# Patient Record
Sex: Female | Born: 1995 | Race: Black or African American | Hispanic: No | Marital: Single | Smoking: Never smoker
Health system: Southern US, Community
[De-identification: ages and names within clinical notes are randomized; demographics above are authoritative.]

## PROBLEM LIST (undated history)

## (undated) ENCOUNTER — Inpatient Hospital Stay (HOSPITAL_COMMUNITY): Payer: Self-pay

## (undated) DIAGNOSIS — O26613 Liver and biliary tract disorders in pregnancy, third trimester: Secondary | ICD-10-CM

## (undated) DIAGNOSIS — K831 Obstruction of bile duct: Secondary | ICD-10-CM

## (undated) DIAGNOSIS — A6009 Herpesviral infection of other urogenital tract: Secondary | ICD-10-CM

## (undated) DIAGNOSIS — A549 Gonococcal infection, unspecified: Secondary | ICD-10-CM

## (undated) HISTORY — DX: Liver and biliary tract disorders in pregnancy, third trimester: O26.613

## (undated) HISTORY — DX: Obstruction of bile duct: K83.1

## (undated) HISTORY — DX: Herpesviral infection of other urogenital tract: A60.09

---
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
## 2017-03-10 DIAGNOSIS — O0993 Supervision of high risk pregnancy, unspecified, third trimester: Secondary | ICD-10-CM | POA: Insufficient documentation

## 2017-03-11 ENCOUNTER — Ambulatory Visit (INDEPENDENT_AMBULATORY_CARE_PROVIDER_SITE_OTHER): Payer: BLUE CROSS/BLUE SHIELD | Admitting: Obstetrics and Gynecology

## 2017-03-11 ENCOUNTER — Encounter: Payer: Self-pay | Admitting: Obstetrics and Gynecology

## 2017-03-11 ENCOUNTER — Other Ambulatory Visit (HOSPITAL_COMMUNITY)
Admission: RE | Admit: 2017-03-11 | Discharge: 2017-03-11 | Disposition: A | Discharge status: Home / Self Care | Payer: BLUE CROSS/BLUE SHIELD | Source: Ambulatory Visit | Attending: Obstetrics and Gynecology | Admitting: Obstetrics and Gynecology

## 2017-03-11 DIAGNOSIS — Z3401 Encounter for supervision of normal first pregnancy, first trimester: Secondary | ICD-10-CM

## 2017-03-11 DIAGNOSIS — Z3A10 10 weeks gestation of pregnancy: Secondary | ICD-10-CM | POA: Insufficient documentation

## 2017-03-11 DIAGNOSIS — Z113 Encounter for screening for infections with a predominantly sexual mode of transmission: Secondary | ICD-10-CM | POA: Diagnosis not present

## 2017-03-11 DIAGNOSIS — Z3490 Encounter for supervision of normal pregnancy, unspecified, unspecified trimester: Secondary | ICD-10-CM

## 2017-03-11 MED ORDER — DOXYLAMINE-PYRIDOXINE 10-10 MG PO TBEC: DELAYED_RELEASE_TABLET | ORAL | 6 refills | Status: DC

## 2017-03-11 NOTE — Patient Instructions (Signed)
 First Trimester of Pregnancy The first trimester of pregnancy is from week 1 until the end of week 13 (months 1 through 3). A week after a sperm fertilizes an egg, the egg will implant on the wall of the uterus. This embryo will begin to develop into a baby. Genes from you and your partner will form the baby. The female genes will determine whether the baby will be a boy or a girl. At 6-8 weeks, the eyes and face will be formed, and the heartbeat can be seen on ultrasound. At the end of 12 weeks, all the baby's organs will be formed. Now that you are pregnant, you will want to do everything you can to have a healthy baby. Two of the most important things are to get good prenatal care and to follow your health care provider's instructions. Prenatal care is all the medical care you receive before the baby's birth. This care will help prevent, find, and treat any problems during the pregnancy and childbirth. Body changes during your first trimester Your body goes through many changes during pregnancy. The changes vary from woman to woman.  You may gain or lose a couple of pounds at first.  You may feel sick to your stomach (nauseous) and you may throw up (vomit). If the vomiting is uncontrollable, call your health care provider.  You may tire easily.  You may develop headaches that can be relieved by medicines. All medicines should be approved by your health care provider.  You may urinate more often. Painful urination may mean you have a bladder infection.  You may develop heartburn as a result of your pregnancy.  You may develop constipation because certain hormones are causing the muscles that push stool through your intestines to slow down.  You may develop hemorrhoids or swollen veins (varicose veins).  Your breasts may begin to grow larger and become tender. Your nipples may stick out more, and the tissue that surrounds them (areola) may become darker.  Your gums may bleed and may be  sensitive to brushing and flossing.  Dark spots or blotches (chloasma, mask of pregnancy) may develop on your face. This will likely fade after the baby is born.  Your menstrual periods will stop.  You may have a loss of appetite.  You may develop cravings for certain kinds of food.  You may have changes in your emotions from day to day, such as being excited to be pregnant or being concerned that something may go wrong with the pregnancy and baby.  You may have more vivid and strange dreams.  You may have changes in your hair. These can include thickening of your hair, rapid growth, and changes in texture. Some women also have hair loss during or after pregnancy, or hair that feels dry or thin. Your hair will most likely return to normal after your baby is born.  What to expect at prenatal visits During a routine prenatal visit:  You will be weighed to make sure you and the baby are growing normally.  Your blood pressure will be taken.  Your abdomen will be measured to track your baby's growth.  The fetal heartbeat will be listened to between weeks 10 and 14 of your pregnancy.  Test results from any previous visits will be discussed.  Your health care provider may ask you:  How you are feeling.  If you are feeling the baby move.  If you have had any abnormal symptoms, such as leaking fluid, bleeding, severe   headaches, or abdominal cramping.  If you are using any tobacco products, including cigarettes, chewing tobacco, and electronic cigarettes.  If you have any questions.  Other tests that may be performed during your first trimester include:  Blood tests to find your blood type and to check for the presence of any previous infections. The tests will also be used to check for low iron levels (anemia) and protein on red blood cells (Rh antibodies). Depending on your risk factors, or if you previously had diabetes during pregnancy, you may have tests to check for high blood  sugar that affects pregnant women (gestational diabetes).  Urine tests to check for infections, diabetes, or protein in the urine.  An ultrasound to confirm the proper growth and development of the baby.  Fetal screens for spinal cord problems (spina bifida) and Down syndrome.  HIV (human immunodeficiency virus) testing. Routine prenatal testing includes screening for HIV, unless you choose not to have this test.  You may need other tests to make sure you and the baby are doing well.  Follow these instructions at home: Medicines  Follow your health care provider's instructions regarding medicine use. Specific medicines may be either safe or unsafe to take during pregnancy.  Take a prenatal vitamin that contains at least 600 micrograms (mcg) of folic acid.  If you develop constipation, try taking a stool softener if your health care provider approves. Eating and drinking  Eat a balanced diet that includes fresh fruits and vegetables, whole grains, good sources of protein such as meat, eggs, or tofu, and low-fat dairy. Your health care provider will help you determine the amount of weight gain that is right for you.  Avoid raw meat and uncooked cheese. These carry germs that can cause birth defects in the baby.  Eating four or five small meals rather than three large meals a day may help relieve nausea and vomiting. If you start to feel nauseous, eating a few soda crackers can be helpful. Drinking liquids between meals, instead of during meals, also seems to help ease nausea and vomiting.  Limit foods that are high in fat and processed sugars, such as fried and sweet foods.  To prevent constipation: ? Eat foods that are high in fiber, such as fresh fruits and vegetables, whole grains, and beans. ? Drink enough fluid to keep your urine clear or pale yellow. Activity  Exercise only as directed by your health care provider. Most women can continue their usual exercise routine during  pregnancy. Try to exercise for 30 minutes at least 5 days a week. Exercising will help you: ? Control your weight. ? Stay in shape. ? Be prepared for labor and delivery.  Experiencing pain or cramping in the lower abdomen or lower back is a good sign that you should stop exercising. Check with your health care provider before continuing with normal exercises.  Try to avoid standing for long periods of time. Move your legs often if you must stand in one place for a long time.  Avoid heavy lifting.  Wear low-heeled shoes and practice good posture.  You may continue to have sex unless your health care provider tells you not to. Relieving pain and discomfort  Wear a good support bra to relieve breast tenderness.  Take warm sitz baths to soothe any pain or discomfort caused by hemorrhoids. Use hemorrhoid cream if your health care provider approves.  Rest with your legs elevated if you have leg cramps or low back pain.  If you   develop varicose veins in your legs, wear support hose. Elevate your feet for 15 minutes, 3-4 times a day. Limit salt in your diet. Prenatal care  Schedule your prenatal visits by the twelfth week of pregnancy. They are usually scheduled monthly at first, then more often in the last 2 months before delivery.  Write down your questions. Take them to your prenatal visits.  Keep all your prenatal visits as told by your health care provider. This is important. Safety  Wear your seat belt at all times when driving.  Make a list of emergency phone numbers, including numbers for family, friends, the hospital, and police and fire departments. General instructions  Ask your health care provider for a referral to a local prenatal education class. Begin classes no later than the beginning of month 6 of your pregnancy.  Ask for help if you have counseling or nutritional needs during pregnancy. Your health care provider can offer advice or refer you to specialists for help  with various needs.  Do not use hot tubs, steam rooms, or saunas.  Do not douche or use tampons or scented sanitary pads.  Do not cross your legs for long periods of time.  Avoid cat litter boxes and soil used by cats. These carry germs that can cause birth defects in the baby and possibly loss of the fetus by miscarriage or stillbirth.  Avoid all smoking, herbs, alcohol, and medicines not prescribed by your health care provider. Chemicals in these products affect the formation and growth of the baby.  Do not use any products that contain nicotine or tobacco, such as cigarettes and e-cigarettes. If you need help quitting, ask your health care provider. You may receive counseling support and other resources to help you quit.  Schedule a dentist appointment. At home, brush your teeth with a soft toothbrush and be gentle when you floss. Contact a health care provider if:  You have dizziness.  You have mild pelvic cramps, pelvic pressure, or nagging pain in the abdominal area.  You have persistent nausea, vomiting, or diarrhea.  You have a bad smelling vaginal discharge.  You have pain when you urinate.  You notice increased swelling in your face, hands, legs, or ankles.  You are exposed to fifth disease or chickenpox.  You are exposed to German measles (rubella) and have never had it. Get help right away if:  You have a fever.  You are leaking fluid from your vagina.  You have spotting or bleeding from your vagina.  You have severe abdominal cramping or pain.  You have rapid weight gain or loss.  You vomit blood or material that looks like coffee grounds.  You develop a severe headache.  You have shortness of breath.  You have any kind of trauma, such as from a fall or a car accident. Summary  The first trimester of pregnancy is from week 1 until the end of week 13 (months 1 through 3).  Your body goes through many changes during pregnancy. The changes vary from  woman to woman.  You will have routine prenatal visits. During those visits, your health care provider will examine you, discuss any test results you may have, and talk with you about how you are feeling. This information is not intended to replace advice given to you by your health care provider. Make sure you discuss any questions you have with your health care provider. Document Released: 10/13/2001 Document Revised: 09/30/2016 Document Reviewed: 09/30/2016 Elsevier Interactive Patient Education  2017   [employer]  Contraception Choices Contraception (birth control) is the use of any methods or devices to prevent pregnancy. Below are some methods to help avoid pregnancy. Hormonal methods  Contraceptive implant. This is a thin, plastic tube containing progesterone hormone. It does not contain estrogen hormone. Your health care provider inserts the tube in the inner part of the upper arm. The tube can remain in place for up to 3 years. After 3 years, the implant must be removed. The implant prevents the ovaries from releasing an egg (ovulation), thickens the cervical mucus to prevent sperm from entering the uterus, and thins the lining of the inside of the uterus.  Progesterone-only injections. These injections are given every 3 months by your health care provider to prevent pregnancy. This synthetic progesterone hormone stops the ovaries from releasing eggs. It also thickens cervical mucus and changes the uterine lining. This makes it harder for sperm to survive in the uterus.  Birth control pills. These pills contain estrogen and progesterone hormone. They work by preventing the ovaries from releasing eggs (ovulation). They also cause the cervical mucus to thicken, preventing the sperm from entering the uterus. Birth control pills are prescribed by a health care provider.Birth control pills can also be used to treat heavy periods.  Minipill. This type of birth control pill contains only the  progesterone hormone. They are taken every day of each month and must be prescribed by your health care provider.  Birth control patch. The patch contains hormones similar to those in birth control pills. It must be changed once a week and is prescribed by a health care provider.  Vaginal ring. The ring contains hormones similar to those in birth control pills. It is left in the vagina for 3 weeks, removed for 1 week, and then a new one is put back in place. The patient must be comfortable inserting and removing the ring from the vagina.A health care provider's prescription is necessary.  Emergency contraception. Emergency contraceptives prevent pregnancy after unprotected sexual intercourse. This pill can be taken right after sex or up to 5 days after unprotected sex. It is most effective the sooner you take the pills after having sexual intercourse. Most emergency contraceptive pills are available without a prescription. Check with your pharmacist. Do not use emergency contraception as your only form of birth control. Barrier methods  Female condom. This is a thin sheath (latex or rubber) that is worn over the penis during sexual intercourse. It can be used with spermicide to increase effectiveness.  Female condom. This is a soft, loose-fitting sheath that is put into the vagina before sexual intercourse.  Diaphragm. This is a soft, latex, dome-shaped barrier that must be fitted by a health care provider. It is inserted into the vagina, along with a spermicidal jelly. It is inserted before intercourse. The diaphragm should be left in the vagina for 6 to 8 hours after intercourse.  Cervical cap. This is a round, soft, latex or plastic cup that fits over the cervix and must be fitted by a health care provider. The cap can be left in place for up to 48 hours after intercourse.  Sponge. This is a soft, circular piece of polyurethane foam. The sponge has spermicide in it. It is inserted into the vagina  after wetting it and before sexual intercourse.  Spermicides. These are chemicals that kill or block sperm from entering the cervix and uterus. They come in the form of creams, jellies, suppositories, foam, or tablets. They do not   require a prescription. They are inserted into the vagina with an applicator before having sexual intercourse. The process must be repeated every time you have sexual intercourse. Intrauterine contraception  Intrauterine device (IUD). This is a T-shaped device that is put in a woman's uterus during a menstrual period to prevent pregnancy. There are 2 types: ? Copper IUD. This type of IUD is wrapped in copper wire and is placed inside the uterus. Copper makes the uterus and fallopian tubes produce a fluid that kills sperm. It can stay in place for 10 years. ? Hormone IUD. This type of IUD contains the hormone progestin (synthetic progesterone). The hormone thickens the cervical mucus and prevents sperm from entering the uterus, and it also thins the uterine lining to prevent implantation of a fertilized egg. The hormone can weaken or kill the sperm that get into the uterus. It can stay in place for 3-5 years, depending on which type of IUD is used. Permanent methods of contraception  Female tubal ligation. This is when the woman's fallopian tubes are surgically sealed, tied, or blocked to prevent the egg from traveling to the uterus.  Hysteroscopic sterilization. This involves placing a small coil or insert into each fallopian tube. Your doctor uses a technique called hysteroscopy to do the procedure. The device causes scar tissue to form. This results in permanent blockage of the fallopian tubes, so the sperm cannot fertilize the egg. It takes about 3 months after the procedure for the tubes to become blocked. You must use another form of birth control for these 3 months.  Female sterilization. This is when the female has the tubes that carry sperm tied off (vasectomy).This  blocks sperm from entering the vagina during sexual intercourse. After the procedure, the man can still ejaculate fluid (semen). Natural planning methods  Natural family planning. This is not having sexual intercourse or using a barrier method (condom, diaphragm, cervical cap) on days the woman could become pregnant.  Calendar method. This is keeping track of the length of each menstrual cycle and identifying when you are fertile.  Ovulation method. This is avoiding sexual intercourse during ovulation.  Symptothermal method. This is avoiding sexual intercourse during ovulation, using a thermometer and ovulation symptoms.  Post-ovulation method. This is timing sexual intercourse after you have ovulated. Regardless of which type or method of contraception you choose, it is important that you use condoms to protect against the transmission of sexually transmitted infections (STIs). Talk with your health care provider about which form of contraception is most appropriate for you. This information is not intended to replace advice given to you by your health care provider. Make sure you discuss any questions you have with your health care provider. Document Released: 10/19/2005 Document Revised: 03/26/2016 Document Reviewed: 04/13/2013 Elsevier Interactive Patient Education  2017 [employer]   Breastfeeding Deciding to breastfeed is one of the best choices you can make for you and your baby. A change in hormones during pregnancy causes your breast tissue to grow and increases the number and size of your milk ducts. These hormones also allow proteins, sugars, and fats from your blood supply to make breast milk in your milk-producing glands. Hormones prevent breast milk from being released before your baby is born as well as prompt milk flow after birth. Once breastfeeding has begun, thoughts of your baby, as well as his or her sucking or crying, can stimulate the release of milk from your  milk-producing glands. Benefits of breastfeeding For Your Baby  Your   first milk (colostrum) helps your baby's digestive system function better.  There are antibodies in your milk that help your baby fight off infections.  Your baby has a lower incidence of asthma, allergies, and sudden infant death syndrome.  The nutrients in breast milk are better for your baby than infant formulas and are designed uniquely for your baby's needs.  Breast milk improves your baby's brain development.  Your baby is less likely to develop other conditions, such as childhood obesity, asthma, or type 2 diabetes mellitus.  For You  Breastfeeding helps to create a very special bond between you and your baby.  Breastfeeding is convenient. Breast milk is always available at the correct temperature and costs nothing.  Breastfeeding helps to burn calories and helps you lose the weight gained during pregnancy.  Breastfeeding makes your uterus contract to its prepregnancy size faster and slows bleeding (lochia) after you give birth.  Breastfeeding helps to lower your risk of developing type 2 diabetes mellitus, osteoporosis, and breast or ovarian cancer later in life.  Signs that your baby is hungry Early Signs of Hunger  Increased alertness or activity.  Stretching.  Movement of the head from side to side.  Movement of the head and opening of the mouth when the corner of the mouth or cheek is stroked (rooting).  Increased sucking sounds, smacking lips, cooing, sighing, or squeaking.  Hand-to-mouth movements.  Increased sucking of fingers or hands.  Late Signs of Hunger  Fussing.  Intermittent crying.  Extreme Signs of Hunger Signs of extreme hunger will require calming and consoling before your baby will be able to breastfeed successfully. Do not wait for the following signs of extreme hunger to occur before you initiate breastfeeding:  Restlessness.  A loud, strong  cry.  Screaming.  Breastfeeding basics Breastfeeding Initiation  Find a comfortable place to sit or lie down, with your neck and back well supported.  Place a pillow or rolled up blanket under your baby to bring him or her to the level of your breast (if you are seated). Nursing pillows are specially designed to help support your arms and your baby while you breastfeed.  Make sure that your baby's abdomen is facing your abdomen.  Gently massage your breast. With your fingertips, massage from your chest wall toward your nipple in a circular motion. This encourages milk flow. You may need to continue this action during the feeding if your milk flows slowly.  Support your breast with 4 fingers underneath and your thumb above your nipple. Make sure your fingers are well away from your nipple and your baby's mouth.  Stroke your baby's lips gently with your finger or nipple.  When your baby's mouth is open wide enough, quickly bring your baby to your breast, placing your entire nipple and as much of the colored area around your nipple (areola) as possible into your baby's mouth. ? More areola should be visible above your baby's upper lip than below the lower lip. ? Your baby's tongue should be between his or her lower gum and your breast.  Ensure that your baby's mouth is correctly positioned around your nipple (latched). Your baby's lips should create a seal on your breast and be turned out (everted).  It is common for your baby to suck about 2-3 minutes in order to start the flow of breast milk.  Latching Teaching your baby how to latch on to your breast properly is very important. An improper latch can cause nipple pain and decreased   milk supply for you and poor weight gain in your baby. Also, if your baby is not latched onto your nipple properly, he or she may swallow some air during feeding. This can make your baby fussy. Burping your baby when you switch breasts during the feeding can  help to get rid of the air. However, teaching your baby to latch on properly is still the best way to prevent fussiness from swallowing air while breastfeeding. Signs that your baby has successfully latched on to your nipple:  Silent tugging or silent sucking, without causing you pain.  Swallowing heard between every 3-4 sucks.  Muscle movement above and in front of his or her ears while sucking.  Signs that your baby has not successfully latched on to nipple:  Sucking sounds or smacking sounds from your baby while breastfeeding.  Nipple pain.  If you think your baby has not latched on correctly, slip your finger into the corner of your baby's mouth to break the suction and place it between your baby's gums. Attempt breastfeeding initiation again. Signs of Successful Breastfeeding Signs from your baby:  A gradual decrease in the number of sucks or complete cessation of sucking.  Falling asleep.  Relaxation of his or her body.  Retention of a small amount of milk in his or her mouth.  Letting go of your breast by himself or herself.  Signs from you:  Breasts that have increased in firmness, weight, and size 1-3 hours after feeding.  Breasts that are softer immediately after breastfeeding.  Increased milk volume, as well as a change in milk consistency and color by the fifth day of breastfeeding.  Nipples that are not sore, cracked, or bleeding.  Signs That Your Baby is Getting Enough Milk  Wetting at least 1-2 diapers during the first 24 hours after birth.  Wetting at least 5-6 diapers every 24 hours for the first week after birth. The urine should be clear or pale yellow by 5 days after birth.  Wetting 6-8 diapers every 24 hours as your baby continues to grow and develop.  At least 3 stools in a 24-hour period by age 5 days. The stool should be soft and yellow.  At least 3 stools in a 24-hour period by age 7 days. The stool should be seedy and yellow.  No loss of  weight greater than 10% of birth weight during the first 3 days of age.  Average weight gain of 4-7 ounces (113-198 g) per week after age 4 days.  Consistent daily weight gain by age 5 days, without weight loss after the age of 2 weeks.  After a feeding, your baby may spit up a small amount. This is common. Breastfeeding frequency and duration Frequent feeding will help you make more milk and can prevent sore nipples and breast engorgement. Breastfeed when you feel the need to reduce the fullness of your breasts or when your baby shows signs of hunger. This is called "breastfeeding on demand." Avoid introducing a pacifier to your baby while you are working to establish breastfeeding (the first 4-6 weeks after your baby is born). After this time you may choose to use a pacifier. Research has shown that pacifier use during the first year of a baby's life decreases the risk of sudden infant death syndrome (SIDS). Allow your baby to feed on each breast as long as he or she wants. Breastfeed until your baby is finished feeding. When your baby unlatches or falls asleep while feeding from the first   breast, offer the second breast. Because newborns are often sleepy in the first few weeks of life, you may need to awaken your baby to get him or her to feed. Breastfeeding times will vary from baby to baby. However, the following rules can serve as a guide to help you ensure that your baby is properly fed:  Newborns (babies 4 weeks of age or younger) may breastfeed every 1-3 hours.  Newborns should not go longer than 3 hours during the day or 5 hours during the night without breastfeeding.  You should breastfeed your baby a minimum of 8 times in a 24-hour period until you begin to introduce solid foods to your baby at around 6 months of age.  Breast milk pumping Pumping and storing breast milk allows you to ensure that your baby is exclusively fed your breast milk, even at times when you are unable to  breastfeed. This is especially important if you are going back to work while you are still breastfeeding or when you are not able to be present during feedings. Your lactation consultant can give you guidelines on how long it is safe to store breast milk. A breast pump is a machine that allows you to pump milk from your breast into a sterile bottle. The pumped breast milk can then be stored in a refrigerator or freezer. Some breast pumps are operated by hand, while others use electricity. Ask your lactation consultant which type will work best for you. Breast pumps can be purchased, but some hospitals and breastfeeding support groups lease breast pumps on a monthly basis. A lactation consultant can teach you how to hand express breast milk, if you prefer not to use a pump. Caring for your breasts while you breastfeed Nipples can become dry, cracked, and sore while breastfeeding. The following recommendations can help keep your breasts moisturized and healthy:  Avoid using soap on your nipples.  Wear a supportive bra. Although not required, special nursing bras and tank tops are designed to allow access to your breasts for breastfeeding without taking off your entire bra or top. Avoid wearing underwire-style bras or extremely tight bras.  Air dry your nipples for 3-4minutes after each feeding.  Use only cotton bra pads to absorb leaked breast milk. Leaking of breast milk between feedings is normal.  Use lanolin on your nipples after breastfeeding. Lanolin helps to maintain your skin's normal moisture barrier. If you use pure lanolin, you do not need to wash it off before feeding your baby again. Pure lanolin is not toxic to your baby. You may also hand express a few drops of breast milk and gently massage that milk into your nipples and allow the milk to air dry.  In the first few weeks after giving birth, some women experience extremely full breasts (engorgement). Engorgement can make your breasts  feel heavy, warm, and tender to the touch. Engorgement peaks within 3-5 days after you give birth. The following recommendations can help ease engorgement:  Completely empty your breasts while breastfeeding or pumping. You may want to start by applying warm, moist heat (in the shower or with warm water-soaked hand towels) just before feeding or pumping. This increases circulation and helps the milk flow. If your baby does not completely empty your breasts while breastfeeding, pump any extra milk after he or she is finished.  Wear a snug bra (nursing or regular) or tank top for 1-2 days to signal your body to slightly decrease milk production.  Apply ice packs to   your breasts, unless this is too uncomfortable for you.  Make sure that your baby is latched on and positioned properly while breastfeeding.  If engorgement persists after 48 hours of following these recommendations, contact your health care provider or a lactation consultant. Overall health care recommendations while breastfeeding  Eat healthy foods. Alternate between meals and snacks, eating 3 of each per day. Because what you eat affects your breast milk, some of the foods may make your baby more irritable than usual. Avoid eating these foods if you are sure that they are negatively affecting your baby.  Drink milk, fruit juice, and water to satisfy your thirst (about 10 glasses a day).  Rest often, relax, and continue to take your prenatal vitamins to prevent fatigue, stress, and anemia.  Continue breast self-awareness checks.  Avoid chewing and smoking tobacco. Chemicals from cigarettes that pass into breast milk and exposure to secondhand smoke may harm your baby.  Avoid alcohol and drug use, including marijuana. Some medicines that may be harmful to your baby can pass through breast milk. It is important to ask your health care provider before taking any medicine, including all over-the-counter and prescription medicine as well  as vitamin and herbal supplements. It is possible to become pregnant while breastfeeding. If birth control is desired, ask your health care provider about options that will be safe for your baby. Contact a health care provider if:  You feel like you want to stop breastfeeding or have become frustrated with breastfeeding.  You have painful breasts or nipples.  Your nipples are cracked or bleeding.  Your breasts are red, tender, or warm.  You have a swollen area on either breast.  You have a fever or chills.  You have nausea or vomiting.  You have drainage other than breast milk from your nipples.  Your breasts do not become full before feedings by the fifth day after you give birth.  You feel sad and depressed.  Your baby is too sleepy to eat well.  Your baby is having trouble sleeping.  Your baby is wetting less than 3 diapers in a 24-hour period.  Your baby has less than 3 stools in a 24-hour period.  Your baby's skin or the white part of his or her eyes becomes yellow.  Your baby is not gaining weight by 5 days of age. Get help right away if:  Your baby is overly tired (lethargic) and does not want to wake up and feed.  Your baby develops an unexplained fever. This information is not intended to replace advice given to you by your health care provider. Make sure you discuss any questions you have with your health care provider. Document Released: 10/19/2005 Document Revised: 04/01/2016 Document Reviewed: 04/12/2013 Elsevier Interactive Patient Education  2017 [employer]  

## 2017-03-11 NOTE — Progress Notes (Signed)
Patient has no concerns 

## 2017-03-11 NOTE — Progress Notes (Signed)
  Subjective:    Deanna Perez is a G1P0 933w1d being seen today for her first obstetrical visit.  Her obstetrical history is significant for first pregnancy. Patient does intend to breast feed. Pregnancy history fully reviewed.  Patient reports no complaints.  Vitals:   03/11/17 1335 03/11/17 1338  BP: 118/79   Pulse: 94   Weight: 120 lb 9.6 oz (54.7 kg)   Height:  5\' 1"  (1.549 m)    HISTORY: OB History  Gravida Para Term Preterm AB Living  1            SAB TAB Ectopic Multiple Live Births               # Outcome Date GA Lbr Len/2nd Weight Sex Delivery Anes PTL Lv  1 Current              History reviewed. No pertinent past medical history. History reviewed. No pertinent surgical history. Family History  Problem Relation Age of Onset  . Hypertension Mother   . Hypertension Father   . Heart disease Paternal Aunt   . Hypertension Paternal Grandmother   . Diabetes Paternal Grandmother   . Cancer Paternal Grandmother   . Hypertension Paternal Grandfather      Exam    Uterus:     Pelvic Exam:    Perineum: No Hemorrhoids, Normal Perineum   Vulva: normal   Vagina:  normal mucosa, normal discharge   pH:    Cervix: nulliparous appearance, cervix is closed and long   Adnexa: no mass, fullness, tenderness   Bony Pelvis: gynecoid  System: Breast:  normal appearance, no masses or tenderness   Skin: normal coloration and turgor, no rashes    Neurologic: oriented, no focal deficits   Extremities: normal strength, tone, and muscle mass   HEENT extra ocular movement intact   Mouth/Teeth mucous membranes moist, pharynx normal without lesions and dental hygiene good   Neck supple and no masses   Cardiovascular: regular rate and rhythm   Respiratory:  chest clear, no wheezing, crepitations, rhonchi, normal symmetric air entry   Abdomen: soft, non-tender; bowel sounds normal; no masses,  no organomegaly   Urinary:       Assessment:    Pregnancy: G1P0 Patient Active  Problem List   Diagnosis Date Noted  . Supervision of normal pregnancy, antepartum 03/10/2017        Plan:     Initial labs drawn. Prenatal vitamins. Problem list reviewed and updated. Genetic Screening discussed First Screen: ordered.  Ultrasound discussed; fetal survey: requested.  Follow up in 4 weeks. 50% of 30 min visit spent on counseling and coordination of care.     Garcia Dalzell 03/11/2017

## 2017-03-12 LAB — CERVICOVAGINAL ANCILLARY ONLY
Bacterial vaginitis: POSITIVE — AB
Candida vaginitis: POSITIVE — AB
Chlamydia: NEGATIVE
Neisseria Gonorrhea: NEGATIVE
Trichomonas: NEGATIVE

## 2017-03-14 LAB — URINE CULTURE, OB REFLEX

## 2017-03-14 LAB — CULTURE, OB URINE

## 2017-03-15 ENCOUNTER — Other Ambulatory Visit: Payer: Self-pay | Admitting: Obstetrics and Gynecology

## 2017-03-15 ENCOUNTER — Telehealth: Payer: Self-pay | Admitting: *Deleted

## 2017-03-15 MED ORDER — METRONIDAZOLE 500 MG PO TABS: 500.0000 mg | ORAL_TABLET | Freq: Two times a day (BID) | ORAL | 0 refills | Status: DC

## 2017-03-15 MED ORDER — CEPHALEXIN 500 MG PO CAPS: 500.0000 mg | ORAL_CAPSULE | Freq: Four times a day (QID) | ORAL | 0 refills | Status: DC

## 2017-03-15 MED ORDER — FLUCONAZOLE 150 MG PO TABS: 150.0000 mg | ORAL_TABLET | Freq: Once | ORAL | 0 refills | Status: AC

## 2017-03-15 NOTE — Telephone Encounter (Signed)
-----   Message from Catalina AntiguaPeggy Constant, MD sent at 03/15/2017  8:31 AM EDT ----- Please inform patient of UTI, BV and yeast infection. All Rx have been e-prescribed.  I would advise that she starts treatment for UTI with Keflex. Starting on day 3 of Keflex, she can begin treatment for BV with Flagyl. On day 3 of Flagyl, she can take her one time course of diflucan for her yeast infection  Thanks  Kinder Morgan EnergyPeggy

## 2017-03-15 NOTE — Telephone Encounter (Signed)
Pt notified of lab results and Rx's sent by provider.

## 2017-03-16 LAB — HEMOGLOBINOPATHY EVALUATION
HGB C: 0 %
HGB S: 0 %
HGB VARIANT: 0 %
Hemoglobin A2 Quantitation: 2.5 % (ref 1.8–3.2)
Hemoglobin F Quantitation: 0 % (ref 0.0–2.0)
Hgb A: 97.5 % (ref 96.4–98.8)

## 2017-03-16 LAB — OBSTETRIC PANEL, INCLUDING HIV
Antibody Screen: NEGATIVE
Basophils Absolute: 0 10*3/uL (ref 0.0–0.2)
Basos: 0 %
EOS (ABSOLUTE): 0.3 10*3/uL (ref 0.0–0.4)
Eos: 6 %
HIV Screen 4th Generation wRfx: NONREACTIVE
Hematocrit: 35.2 % (ref 34.0–46.6)
Hemoglobin: 11.5 g/dL (ref 11.1–15.9)
Hepatitis B Surface Ag: NEGATIVE
Immature Grans (Abs): 0 10*3/uL (ref 0.0–0.1)
Immature Granulocytes: 0 %
Lymphocytes Absolute: 2.1 10*3/uL (ref 0.7–3.1)
Lymphs: 38 %
MCH: 27.4 pg (ref 26.6–33.0)
MCHC: 32.7 g/dL (ref 31.5–35.7)
MCV: 84 fL (ref 79–97)
Monocytes Absolute: 0.8 10*3/uL (ref 0.1–0.9)
Monocytes: 14 %
Neutrophils Absolute: 2.3 10*3/uL (ref 1.4–7.0)
Neutrophils: 42 %
Platelets: 248 10*3/uL (ref 150–379)
RBC: 4.2 x10E6/uL (ref 3.77–5.28)
RDW: 12.7 % (ref 12.3–15.4)
RPR Ser Ql: NONREACTIVE
Rh Factor: POSITIVE
Rubella Antibodies, IGG: 4.19 index (ref >0.99)
WBC: 5.4 10*3/uL (ref 3.4–10.8)

## 2017-03-16 LAB — CYSTIC FIBROSIS MUTATION 97: Interpretation: NOT DETECTED

## 2017-03-16 LAB — COMPREHENSIVE METABOLIC PANEL
ALT: 18 IU/L (ref 0–32)
AST: 15 IU/L (ref 0–40)
Albumin/Globulin Ratio: 1.4 (ref 1.2–2.2)
Albumin: 4.4 g/dL (ref 3.5–5.5)
Alkaline Phosphatase: 58 IU/L (ref 39–117)
BUN/Creatinine Ratio: 15 (ref 9–23)
BUN: 9 mg/dL (ref 6–20)
Bilirubin Total: 0.2 mg/dL (ref 0.0–1.2)
CO2: 18 mmol/L (ref 18–29)
Calcium: 9.5 mg/dL (ref 8.7–10.2)
Chloride: 96 mmol/L (ref 96–106)
Creatinine, Ser: 0.62 mg/dL (ref 0.57–1.00)
GFR calc Af Amer: 150 mL/min/{1.73_m2} (ref >59)
GFR calc non Af Amer: 130 mL/min/{1.73_m2} (ref >59)
Globulin, Total: 3.1 g/dL (ref 1.5–4.5)
Glucose: 67 mg/dL (ref 65–99)
Potassium: 4.2 mmol/L (ref 3.5–5.2)
Sodium: 134 mmol/L (ref 134–144)
Total Protein: 7.5 g/dL (ref 6.0–8.5)

## 2017-03-16 LAB — HEMOGLOBIN A1C
Est. average glucose Bld gHb Est-mCnc: 105 mg/dL
Hgb A1c MFr Bld: 5.3 % (ref 4.8–5.6)

## 2017-03-16 LAB — VARICELLA ZOSTER ANTIBODY, IGG: Varicella zoster IgG: 3826 index (ref >165)

## 2017-03-31 ENCOUNTER — Encounter (HOSPITAL_COMMUNITY): Payer: Self-pay

## 2017-03-31 ENCOUNTER — Ambulatory Visit (HOSPITAL_COMMUNITY)
Admission: RE | Admit: 2017-03-31 | Discharge: 2017-03-31 | Disposition: A | Discharge status: Home / Self Care | Payer: BLUE CROSS/BLUE SHIELD | Source: Ambulatory Visit | Attending: Obstetrics and Gynecology | Admitting: Obstetrics and Gynecology

## 2017-03-31 DIAGNOSIS — Z3A13 13 weeks gestation of pregnancy: Secondary | ICD-10-CM | POA: Diagnosis not present

## 2017-03-31 DIAGNOSIS — Z3491 Encounter for supervision of normal pregnancy, unspecified, first trimester: Secondary | ICD-10-CM | POA: Insufficient documentation

## 2017-03-31 DIAGNOSIS — Z3682 Encounter for antenatal screening for nuchal translucency: Secondary | ICD-10-CM | POA: Diagnosis not present

## 2017-03-31 DIAGNOSIS — Z3490 Encounter for supervision of normal pregnancy, unspecified, unspecified trimester: Secondary | ICD-10-CM

## 2017-03-31 NOTE — Addendum Note (Signed)
Encounter addended by: Joycelyn RuaMurrow, Nicholson Starace Mae on: 03/31/2017  3:19 PM<BR>    Actions taken: Imaging Exam ended

## 2017-04-08 ENCOUNTER — Ambulatory Visit (INDEPENDENT_AMBULATORY_CARE_PROVIDER_SITE_OTHER): Payer: BLUE CROSS/BLUE SHIELD | Admitting: Obstetrics and Gynecology

## 2017-04-08 DIAGNOSIS — Z3492 Encounter for supervision of normal pregnancy, unspecified, second trimester: Secondary | ICD-10-CM

## 2017-04-08 DIAGNOSIS — Z3490 Encounter for supervision of normal pregnancy, unspecified, unspecified trimester: Secondary | ICD-10-CM

## 2017-04-08 NOTE — Progress Notes (Signed)
Patient is in the office, states no complaints today. 

## 2017-04-08 NOTE — Progress Notes (Signed)
   PRENATAL VISIT NOTE  Subjective:  Deanna Perez is a 21 y.o. G1P0 at 4165w1d being seen today for ongoing prenatal care.  She is currently monitored for the following issues for this low-risk pregnancy and has Supervision of normal pregnancy, antepartum on her problem list.  Patient reports no complaints.  Contractions: Not present. Vag. Bleeding: None.   . Denies leaking of fluid.   The following portions of the patient's history were reviewed and updated as appropriate: allergies, current medications, past family history, past medical history, past social history, past surgical history and problem list. Problem list updated.  Objective:   Vitals:   04/08/17 1121  BP: 114/79  Pulse: 76  Weight: 118 lb 3.2 oz (53.6 kg)    Fetal Status: Fetal Heart Rate (bpm): 152         General:  Alert, oriented and cooperative. Patient is in no acute distress.  Skin: Skin is warm and dry. No rash noted.   Cardiovascular: Normal heart rate noted  Respiratory: Normal respiratory effort, no problems with respiration noted  Abdomen: Soft, gravid, appropriate for gestational age. Pain/Pressure: Absent     Pelvic:  Cervical exam deferred        Extremities: Normal range of motion.  Edema: None  Mental Status: Normal mood and affect. Normal behavior. Normal judgment and thought content.   Assessment and Plan:  Pregnancy: G1P0 at 4465w1d  1. Encounter for supervision of normal pregnancy, antepartum, unspecified gravidity Patient is doing well without complaints Anatomy ultrasound ordered - US MFM OB COMP + 14 WK; Future  General obstetric precautions including but not limited to vaginal bleeding, contractions, leaking of fluid and fetal movement were reviewed in detail with the patient. Please refer to After Visit Summary for other counseling recommendations.  Return in about 4 weeks (around 05/06/2017) for ROB.   Catalina AntiguaPeggy Erskin Zinda, MD

## 2017-05-03 ENCOUNTER — Other Ambulatory Visit (HOSPITAL_COMMUNITY): Payer: Self-pay

## 2017-05-06 ENCOUNTER — Ambulatory Visit (INDEPENDENT_AMBULATORY_CARE_PROVIDER_SITE_OTHER): Payer: BLUE CROSS/BLUE SHIELD | Admitting: Obstetrics & Gynecology

## 2017-05-06 DIAGNOSIS — Z3490 Encounter for supervision of normal pregnancy, unspecified, unspecified trimester: Secondary | ICD-10-CM

## 2017-05-06 DIAGNOSIS — Z3402 Encounter for supervision of normal first pregnancy, second trimester: Secondary | ICD-10-CM

## 2017-05-06 NOTE — Patient Instructions (Signed)

## 2017-05-06 NOTE — Progress Notes (Signed)
   PRENATAL VISIT NOTE  Subjective:  Deanna Perez is a 21 y.o. G1P0 at 7497w1d being seen today for ongoing prenatal care.  She is currently monitored for the following issues for this low-risk pregnancy and has Supervision of normal pregnancy, antepartum on her problem list.  Patient reports had episode of syncope .  Contractions: Not present. Vag. Bleeding: None.  Movement: Present. Denies leaking of fluid.   The following portions of the patient's history were reviewed and updated as appropriate: allergies, current medications, past family history, past medical history, past social history, past surgical history and problem list. Problem list updated.  Objective:   Vitals:   05/06/17 1419  BP: 108/68  Pulse: 98  Weight: 122 lb (55.3 kg)    Fetal Status: Fetal Heart Rate (bpm): 148   Movement: Present     General:  Alert, oriented and cooperative. Patient is in no acute distress.  Skin: Skin is warm and dry. No rash noted.   Cardiovascular: Normal heart rate noted  Respiratory: Normal respiratory effort, no problems with respiration noted  Abdomen: Soft, gravid, appropriate for gestational age. Pain/Pressure: Absent     Pelvic:  Cervical exam deferred        Extremities: Normal range of motion.  Edema: Trace  Mental Status: Normal mood and affect. Normal behavior. Normal judgment and thought content.   Assessment and Plan:  Pregnancy: G1P0 at 3197w1d  1. Encounter for supervision of normal pregnancy, antepartum, unspecified gravidity  AFP only  Preterm labor symptoms and general obstetric precautions including but not limited to vaginal bleeding, contractions, leaking of fluid and fetal movement were reviewed in detail with the patient. Please refer to After Visit Summary for other counseling recommendations.  Return in about 4 weeks (around 06/03/2017).   Scheryl DarterJames Arnold, MD

## 2017-05-06 NOTE — Progress Notes (Signed)
Patient was out of [place] and got light headed and fell. She was Dx with Syncope.

## 2017-05-12 ENCOUNTER — Ambulatory Visit (HOSPITAL_COMMUNITY)
Admission: RE | Admit: 2017-05-12 | Discharge: 2017-05-12 | Disposition: A | Discharge status: Home / Self Care | Payer: BLUE CROSS/BLUE SHIELD | Source: Ambulatory Visit | Attending: Obstetrics and Gynecology | Admitting: Obstetrics and Gynecology

## 2017-05-12 DIAGNOSIS — Z3490 Encounter for supervision of normal pregnancy, unspecified, unspecified trimester: Secondary | ICD-10-CM | POA: Diagnosis not present

## 2017-05-12 DIAGNOSIS — Z3A19 19 weeks gestation of pregnancy: Secondary | ICD-10-CM | POA: Diagnosis not present

## 2017-05-12 LAB — AFP, SERUM, OPEN SPINA BIFIDA
AFP MOM: 2.43
AFP Value: 133.6 ng/mL
Gest. Age on Collection Date: 18.1 weeks
Maternal Age At EDD: 21.2 yr
OSBR RISK 1 IN: 658
TEST RESULTS AFP: NEGATIVE
WEIGHT: 122 [lb_av]

## 2017-06-03 ENCOUNTER — Ambulatory Visit (INDEPENDENT_AMBULATORY_CARE_PROVIDER_SITE_OTHER): Payer: BLUE CROSS/BLUE SHIELD | Admitting: Obstetrics and Gynecology

## 2017-06-03 VITALS — BP 101/67 | HR 78 | Wt 126.8 lb

## 2017-06-03 DIAGNOSIS — Z3402 Encounter for supervision of normal first pregnancy, second trimester: Secondary | ICD-10-CM

## 2017-06-03 DIAGNOSIS — Z3400 Encounter for supervision of normal first pregnancy, unspecified trimester: Secondary | ICD-10-CM

## 2017-06-03 NOTE — Progress Notes (Signed)
   PRENATAL VISIT NOTE  Subjective:  Deanna Perez is a 21 y.o. G1P0 at 4461w1d being seen today for ongoing prenatal care.  She is currently monitored for the following issues for this low-risk pregnancy and has Supervision of normal pregnancy, antepartum on her problem list.  Patient reports no complaints.  Contractions: Not present. Vag. Bleeding: None.  Movement: Present. Denies leaking of fluid.   The following portions of the patient's history were reviewed and updated as appropriate: allergies, current medications, past family history, past medical history, past social history, past surgical history and problem list. Problem list updated.  Objective:   Vitals:   06/03/17 1504  BP: 101/67  Pulse: 78  Weight: 126 lb 12.8 oz (57.5 kg)    Fetal Status: Fetal Heart Rate (bpm): 148 Fundal Height: 22 cm Movement: Present     General:  Alert, oriented and cooperative. Patient is in no acute distress.  Skin: Skin is warm and dry. No rash noted.   Cardiovascular: Normal heart rate noted  Respiratory: Normal respiratory effort, no problems with respiration noted  Abdomen: Soft, gravid, appropriate for gestational age.  Pain/Pressure: Absent     Pelvic: Cervical exam deferred        Extremities: Normal range of motion.  Edema: Trace  Mental Status:  Normal mood and affect. Normal behavior. Normal judgment and thought content.   Assessment and Plan:  Pregnancy: G1P0 at 5961w1d  1. Supervision of normal first pregnancy, antepartum Patient is doing well without complaints Third trimester labs next visit Anatomy ultrasound results reviewed with the patient  Preterm labor symptoms and general obstetric precautions including but not limited to vaginal bleeding, contractions, leaking of fluid and fetal movement were reviewed in detail with the patient. Please refer to After Visit Summary for other counseling recommendations.  Return in about 4 weeks (around 07/01/2017) for ROB, 2 hr  glucola next visit.   Catalina AntiguaPeggy Aeralyn Barna, MD

## 2017-06-14 ENCOUNTER — Inpatient Hospital Stay (HOSPITAL_COMMUNITY)
Admission: AD | Admit: 2017-06-14 | Discharge: 2017-06-23 | DRG: 781 | Disposition: A | Discharge status: Home / Self Care | Payer: BLUE CROSS/BLUE SHIELD | Source: Ambulatory Visit | Attending: Obstetrics and Gynecology | Admitting: Obstetrics and Gynecology

## 2017-06-14 DIAGNOSIS — Z3A24 24 weeks gestation of pregnancy: Secondary | ICD-10-CM

## 2017-06-14 DIAGNOSIS — K5900 Constipation, unspecified: Secondary | ICD-10-CM | POA: Diagnosis present

## 2017-06-14 DIAGNOSIS — W182XXA Fall in (into) shower or empty bathtub, initial encounter: Secondary | ICD-10-CM | POA: Diagnosis present

## 2017-06-14 DIAGNOSIS — O4592 Premature separation of placenta, unspecified, second trimester: Principal | ICD-10-CM | POA: Diagnosis present

## 2017-06-14 DIAGNOSIS — O99012 Anemia complicating pregnancy, second trimester: Secondary | ICD-10-CM | POA: Diagnosis present

## 2017-06-14 DIAGNOSIS — Y93F1 Activity, caregiving, bathing: Secondary | ICD-10-CM

## 2017-06-14 DIAGNOSIS — Y92231 Patient bathroom in hospital as the place of occurrence of the external cause: Secondary | ICD-10-CM | POA: Diagnosis present

## 2017-06-14 DIAGNOSIS — O4692 Antepartum hemorrhage, unspecified, second trimester: Secondary | ICD-10-CM | POA: Diagnosis present

## 2017-06-14 DIAGNOSIS — D509 Iron deficiency anemia, unspecified: Secondary | ICD-10-CM | POA: Diagnosis present

## 2017-06-14 DIAGNOSIS — Z3A23 23 weeks gestation of pregnancy: Secondary | ICD-10-CM

## 2017-06-14 DIAGNOSIS — O321XX0 Maternal care for breech presentation, not applicable or unspecified: Secondary | ICD-10-CM | POA: Diagnosis present

## 2017-06-14 NOTE — MAU Note (Signed)
Pt here with c/o vaginal bleeding starting about 5 pm. Had one small clot. Some abdominal cramping started after the bleeding. Reports positive fetal movement.

## 2017-06-14 NOTE — MAU Note (Signed)
Urine in lab 

## 2017-06-15 ENCOUNTER — Inpatient Hospital Stay (HOSPITAL_COMMUNITY): Payer: BLUE CROSS/BLUE SHIELD

## 2017-06-15 ENCOUNTER — Encounter (HOSPITAL_COMMUNITY): Payer: Self-pay | Admitting: *Deleted

## 2017-06-15 DIAGNOSIS — O4692 Antepartum hemorrhage, unspecified, second trimester: Secondary | ICD-10-CM | POA: Diagnosis present

## 2017-06-15 DIAGNOSIS — O99012 Anemia complicating pregnancy, second trimester: Secondary | ICD-10-CM | POA: Diagnosis present

## 2017-06-15 DIAGNOSIS — W182XXA Fall in (into) shower or empty bathtub, initial encounter: Secondary | ICD-10-CM | POA: Diagnosis present

## 2017-06-15 DIAGNOSIS — K5900 Constipation, unspecified: Secondary | ICD-10-CM | POA: Diagnosis present

## 2017-06-15 DIAGNOSIS — Z3A23 23 weeks gestation of pregnancy: Secondary | ICD-10-CM | POA: Diagnosis not present

## 2017-06-15 DIAGNOSIS — D509 Iron deficiency anemia, unspecified: Secondary | ICD-10-CM | POA: Diagnosis present

## 2017-06-15 DIAGNOSIS — Y92231 Patient bathroom in hospital as the place of occurrence of the external cause: Secondary | ICD-10-CM | POA: Diagnosis present

## 2017-06-15 DIAGNOSIS — Z3A24 24 weeks gestation of pregnancy: Secondary | ICD-10-CM | POA: Diagnosis not present

## 2017-06-15 DIAGNOSIS — Y93F1 Activity, caregiving, bathing: Secondary | ICD-10-CM | POA: Diagnosis not present

## 2017-06-15 DIAGNOSIS — O4592 Premature separation of placenta, unspecified, second trimester: Secondary | ICD-10-CM | POA: Diagnosis not present

## 2017-06-15 DIAGNOSIS — O321XX0 Maternal care for breech presentation, not applicable or unspecified: Secondary | ICD-10-CM | POA: Diagnosis present

## 2017-06-15 LAB — TYPE AND SCREEN
ABO/RH(D): A POS
Antibody Screen: NEGATIVE

## 2017-06-15 LAB — CBC WITH DIFFERENTIAL/PLATELET
BASOS PCT: 0 %
Basophils Absolute: 0 10*3/uL (ref 0.0–0.1)
EOS ABS: 0.2 10*3/uL (ref 0.0–0.7)
Eosinophils Relative: 2 %
HCT: 28.2 % — ABNORMAL LOW (ref 36.0–46.0)
HEMOGLOBIN: 9.4 g/dL — AB (ref 12.0–15.0)
Lymphocytes Relative: 29 %
Lymphs Abs: 3.3 10*3/uL (ref 0.7–4.0)
MCH: 28.3 pg (ref 26.0–34.0)
MCHC: 33.3 g/dL (ref 30.0–36.0)
MCV: 84.9 fL (ref 78.0–100.0)
MONOS PCT: 7 %
Monocytes Absolute: 0.8 10*3/uL (ref 0.1–1.0)
Neutro Abs: 7.1 10*3/uL (ref 1.7–7.7)
Neutrophils Relative %: 62 %
Platelets: 230 10*3/uL (ref 150–400)
RBC: 3.32 MIL/uL — ABNORMAL LOW (ref 3.87–5.11)
RDW: 13.8 % (ref 11.5–15.5)
WBC: 11.3 10*3/uL — ABNORMAL HIGH (ref 4.0–10.5)

## 2017-06-15 LAB — WET PREP, GENITAL
CLUE CELLS WET PREP: NONE SEEN
Sperm: NONE SEEN
Trich, Wet Prep: NONE SEEN
YEAST WET PREP: NONE SEEN

## 2017-06-15 LAB — ABO/RH: ABO/RH(D): A POS

## 2017-06-15 LAB — RPR: RPR: NONREACTIVE

## 2017-06-15 MED ORDER — PRENATAL MULTIVITAMIN CH
1.0000 | ORAL_TABLET | Freq: Every day | ORAL | Status: DC
  Administered 2017-06-15 – 2017-06-23 (×9): 1 via ORAL
  Filled 2017-06-15 (×10): qty 1

## 2017-06-15 MED ORDER — CALCIUM CARBONATE ANTACID 500 MG PO CHEW
2.0000 | CHEWABLE_TABLET | ORAL | Status: DC | PRN
  Administered 2017-06-22: 400 mg via ORAL
  Filled 2017-06-15: qty 2

## 2017-06-15 MED ORDER — ACETAMINOPHEN 325 MG PO TABS
650.0000 mg | ORAL_TABLET | ORAL | Status: DC | PRN
  Administered 2017-06-15 – 2017-06-19 (×3): 650 mg via ORAL
  Filled 2017-06-15 (×3): qty 2

## 2017-06-15 MED ORDER — DOCUSATE SODIUM 100 MG PO CAPS
100.0000 mg | ORAL_CAPSULE | Freq: Every day | ORAL | Status: DC
  Administered 2017-06-15 – 2017-06-22 (×5): 100 mg via ORAL
  Filled 2017-06-15 (×7): qty 1

## 2017-06-15 NOTE — H&P (Signed)
Deanna Perez is a 21 y.o. G1P0 at 6635w6d who presents to [facility] today with complaint of vaginal bleeding since 1700 last night. The patient denies recent intercourse, abdominal trauma or exam. She states that bleeding was heavier initially with some small clots and is a little lighter now. She states mild suprapubic pain that has been off and on since last night. She rates her pain at 5/10 now and has not taken anything for pain. She reports normal fetal movement. She denies contractions, LOF or complications with the pregnancy.           OB History    Gravida Para Term Preterm AB Living   1             SAB TAB Ectopic Multiple Live Births                      Past Medical History:  Diagnosis Date  . Medical history non-contributory          Past Surgical History:  Procedure Laterality Date  . NO PAST SURGERIES           Family History  Problem Relation Age of Onset  . Hypertension Mother   . Hypertension Father   . Heart disease Paternal Aunt   . Hypertension Paternal Grandmother   . Diabetes Paternal Grandmother   . Cancer Paternal Grandmother   . Hypertension Paternal Grandfather           Social History  Substance Use Topics  . Smoking status: Never Smoker  . Smokeless tobacco: Never Used  . Alcohol use No     Comment: occasional    Allergies: No Known Allergies         Prescriptions Prior to Admission  Medication Sig Dispense Refill Last Dose  . Prenatal Vit-Fe Fumarate-FA (PRENATAL VITAMIN PO) Take by mouth.   06/15/2017 at Unknown time  . cephALEXin (KEFLEX) 500 MG capsule Take 1 capsule (500 mg total) by mouth 4 (four) times daily. (Patient not taking: Reported on 03/31/2017) 28 capsule 0 Not Taking  . Doxylamine-Pyridoxine 10-10 MG TBEC Take 2 tabs at bedtime. If symptoms persist after 2 days, take 1 tab in the morning and 2 tabs at bedtime (Patient not taking: Reported on 03/31/2017) 100 tablet 6 Not Taking  .  metroNIDAZOLE (FLAGYL) 500 MG tablet Take 1 tablet (500 mg total) by mouth 2 (two) times daily. (Patient not taking: Reported on 04/08/2017) 14 tablet 0 Not Taking    Review of Systems  Constitutional: Negative for fever.  Gastrointestinal: Negative for abdominal pain, constipation, diarrhea, nausea and vomiting.  Genitourinary: Positive for pelvic pain and vaginal bleeding. Negative for vaginal discharge.   Physical Exam   Blood pressure 111/66, pulse 87, temperature 98.3 F (36.8 C), temperature source Oral, resp. rate 18, height 5' (1.524 m), weight 128 lb (58.1 kg), last menstrual period 12/30/2016, SpO2 99 %.  Physical Exam  Nursing note and vitals reviewed. Constitutional: She is oriented to person, place, and time. She appears well-developed and well-nourished. No distress.  HENT:  Head: Normocephalic and atraumatic.  Cardiovascular: Normal rate.   Respiratory: Effort normal.  GI: Soft. She exhibits no distension. There is no tenderness. There is no rebound.  Genitourinary: Uterus is enlarged. Uterus is not tender. Cervix exhibits no motion tenderness, no discharge and no friability. There is bleeding (small with small clot) in the vagina. No vaginal discharge found.  Neurological: She is alert and oriented to person, place,  and time.  Skin: Skin is warm and dry. No erythema.  Psychiatric: She has a normal mood and affect.  Dilation: Closed Effacement (%): 40 Exam by:: julie wenzel  PA        Results for orders placed or performed during the hospital encounter of 06/14/17 (from the past 24 hour(s))  CBC with Differential/Platelet     Status: Abnormal   Collection Time: 06/15/17  1:34 AM  Result Value Ref Range   WBC 11.3 (H) 4.0 - 10.5 K/uL   RBC 3.32 (L) 3.87 - 5.11 MIL/uL   Hemoglobin 9.4 (L) 12.0 - 15.0 g/dL   HCT 16.1 (L) 09.6 - 04.5 %   MCV 84.9 78.0 - 100.0 fL   MCH 28.3 26.0 - 34.0 pg   MCHC 33.3 30.0 - 36.0 g/dL   RDW 40.9 81.1 - 91.4 %    Platelets 230 150 - 400 K/uL   Neutrophils Relative % 62 %   Neutro Abs 7.1 1.7 - 7.7 K/uL   Lymphocytes Relative 29 %   Lymphs Abs 3.3 0.7 - 4.0 K/uL   Monocytes Relative 7 %   Monocytes Absolute 0.8 0.1 - 1.0 K/uL   Eosinophils Relative 2 %   Eosinophils Absolute 0.2 0.0 - 0.7 K/uL   Basophils Relative 0 %   Basophils Absolute 0.0 0.0 - 0.1 K/uL    Fetal Monitoring: Baseline: 140 bpm Variability: moderate Accelerations: 10 x 10 Decelerations: none Contractions: none  Preliminary [place] results show no evidence of placenta previa or abruption, normal AFI and cervical length of 3.8 cm.   Assessment and Plan  A: 21 yo G1P0 at [redacted]w[redacted]d with vaginal bleeding in second trimester - Admit for 7 day observation - Routine antepartum care

## 2017-06-15 NOTE — MAU Provider Note (Signed)
History     CSN: [id number]  Arrival date and time: 06/14/17 2324   First Provider Initiated Contact with Patient 06/15/17 0041      Chief Complaint  Patient presents with  . Vaginal Bleeding   HPI Deanna Perez is a 21 y.o. G1P0 at 3758w6d who presents to MAU today with complaint of vaginal bleeding since 1700 last night. The patient denies recent intercourse, abdominal trauma or exam. She states that bleeding was heavier initially with some small clots and is a little lighter now. She states mild suprapubic pain that has been off and on since last night. She rates her pain at 5/10 now and has not taken anything for pain. She reports normal fetal movement. She denies contractions, LOF or complications with the pregnancy.   OB History    Gravida Para Term Preterm AB Living   1             SAB TAB Ectopic Multiple Live Births                  Past Medical History:  Diagnosis Date  . Medical history non-contributory     Past Surgical History:  Procedure Laterality Date  . NO PAST SURGERIES      Family History  Problem Relation Age of Onset  . Hypertension Mother   . Hypertension Father   . Heart disease Paternal Aunt   . Hypertension Paternal Grandmother   . Diabetes Paternal Grandmother   . Cancer Paternal Grandmother   . Hypertension Paternal Grandfather     Social History  Substance Use Topics  . Smoking status: Never Smoker  . Smokeless tobacco: Never Used  . Alcohol use No     Comment: occasional    Allergies: No Known Allergies  Prescriptions Prior to Admission  Medication Sig Dispense Refill Last Dose  . Prenatal Vit-Fe Fumarate-FA (PRENATAL VITAMIN PO) Take by mouth.   06/15/2017 at Unknown time  . cephALEXin (KEFLEX) 500 MG capsule Take 1 capsule (500 mg total) by mouth 4 (four) times daily. (Patient not taking: Reported on 03/31/2017) 28 capsule 0 Not Taking  . Doxylamine-Pyridoxine 10-10 MG TBEC Take 2 tabs at bedtime. If symptoms persist after  2 days, take 1 tab in the morning and 2 tabs at bedtime (Patient not taking: Reported on 03/31/2017) 100 tablet 6 Not Taking  . metroNIDAZOLE (FLAGYL) 500 MG tablet Take 1 tablet (500 mg total) by mouth 2 (two) times daily. (Patient not taking: Reported on 04/08/2017) 14 tablet 0 Not Taking    Review of Systems  Constitutional: Negative for fever.  Gastrointestinal: Negative for abdominal pain, constipation, diarrhea, nausea and vomiting.  Genitourinary: Positive for pelvic pain and vaginal bleeding. Negative for vaginal discharge.   Physical Exam   Blood pressure 111/66, pulse 87, temperature 98.3 F (36.8 C), temperature source Oral, resp. rate 18, height 5' (1.524 m), weight 128 lb (58.1 kg), last menstrual period 12/30/2016, SpO2 99 %.  Physical Exam  Nursing note and vitals reviewed. Constitutional: She is oriented to person, place, and time. She appears well-developed and well-nourished. No distress.  HENT:  Head: Normocephalic and atraumatic.  Cardiovascular: Normal rate.   Respiratory: Effort normal.  GI: Soft. She exhibits no distension. There is no tenderness. There is no rebound.  Genitourinary: Uterus is enlarged. Uterus is not tender. Cervix exhibits no motion tenderness, no discharge and no friability. There is bleeding (small with small clot) in the vagina. No vaginal discharge found.  Neurological: She  is alert and oriented to person, place, and time.  Skin: Skin is warm and dry. No erythema.  Psychiatric: She has a normal mood and affect.  Dilation: Closed Effacement (%): 40 Exam by:: Deanna Derocher  PA   Results for orders placed or performed during the hospital encounter of 06/14/17 (from the past 24 hour(s))  CBC with Differential/Platelet     Status: Abnormal   Collection Time: 06/15/17  1:34 AM  Result Value Ref Range   WBC 11.3 (H) 4.0 - 10.5 K/uL   RBC 3.32 (L) 3.87 - 5.11 MIL/uL   Hemoglobin 9.4 (L) 12.0 - 15.0 g/dL   HCT 95.6 (L) 21.3 - 08.6 %   MCV 84.9  78.0 - 100.0 fL   MCH 28.3 26.0 - 34.0 pg   MCHC 33.3 30.0 - 36.0 g/dL   RDW 57.8 46.9 - 62.9 %   Platelets 230 150 - 400 K/uL   Neutrophils Relative % 62 %   Neutro Abs 7.1 1.7 - 7.7 K/uL   Lymphocytes Relative 29 %   Lymphs Abs 3.3 0.7 - 4.0 K/uL   Monocytes Relative 7 %   Monocytes Absolute 0.8 0.1 - 1.0 K/uL   Eosinophils Relative 2 %   Eosinophils Absolute 0.2 0.0 - 0.7 K/uL   Basophils Relative 0 %   Basophils Absolute 0.0 0.0 - 0.1 K/uL    Fetal Monitoring: Baseline: 140 bpm Variability: moderate Accelerations: 10 x 10 Decelerations: none Contractions: none  MAU Course  Procedures None  MDM Reviewed most recent US, no evidence of previa Exam reveals small blood with small clots in the vaginal vault near the cervical os.  CBC and [place] ordered today  Preliminary [place] results show no evidence of placenta previa or abruption, normal AFI and cervical length of 3.8 cm.  Discussed patient with Dr. Jolayne Panther. Admit for monitoring. She will enter orders in Epic.  Assessment and Plan  A: [facility] at [redacted]w[redacted]d Vaginal bleeding in pregnancy, second trimester  P:  Admit to [facility]  Vonzella Nipple, PA-C 06/15/2017, 2:10 AM

## 2017-06-16 LAB — GC/CHLAMYDIA PROBE AMP (~~LOC~~) NOT AT ARMC
Chlamydia: NEGATIVE
Neisseria Gonorrhea: NEGATIVE

## 2017-06-16 MED ORDER — BISACODYL 10 MG RE SUPP
10.0000 mg | Freq: Every day | RECTAL | Status: DC | PRN
  Filled 2017-06-16: qty 1

## 2017-06-16 MED ORDER — BETAMETHASONE SOD PHOS & ACET 6 (3-3) MG/ML IJ SUSP
12.0000 mg | INTRAMUSCULAR | Status: AC
  Administered 2017-06-16 – 2017-06-17 (×2): 12 mg via INTRAMUSCULAR
  Filled 2017-06-16 (×2): qty 2

## 2017-06-16 NOTE — Progress Notes (Signed)
Patient ID: Deanna Perez, female   DOB: 1996/09/18, 21 y.o.   MRN: [id number] FACULTY PRACTICE ANTEPARTUM(COMPREHENSIVE) NOTE  Deanna Milletyndaja L Miles is a 21 y.o. G1P0 at 7551w0d by early ultrasound who is admitted for 2nd trimester bleeding.   Fetal presentation is cephalic. Length of Stay:  1  Days  Subjective: Increased bright red bleeding overnight. Has had 3 min bradycardia this am, which has resolved. C/o no BM x 3 days. On stool softeners. Patient reports the fetal movement as active. Patient reports uterine contraction  activity as none. Patient reports  vaginal bleeding as less flow than a normal period. Patient describes fluid per vagina as None.  Vitals:  Blood pressure 97/64, pulse 97, temperature 98.6 F (37 C), temperature source Oral, resp. rate 16, height 5' (1.524 m), weight 128 lb (58.1 kg), last menstrual period 12/30/2016, SpO2 100 %. Physical Examination:  General appearance - alert, well appearing, and in no distress Chest - normal effort Abdomen - gravid, Non-tender Fundal Height:  size equals dates Extremities: Homans sign is negative, no sign of DVT  Membranes:intact  Fetal Monitoring:  Baseline: 140 bpm, Variability: Good {> 6 bpm), Accelerations: Non-reactive but appropriate for gestational age and Decelerationone bradycardiaone bradycardia  Medications:  Scheduled . betamethasone acetate-betamethasone sodium phosphate  12 mg Intramuscular Q24 Hr x 2  . docusate sodium  100 mg Oral Daily  . prenatal multivitamin  1 tablet Oral Q1200   I have reviewed the patient's current medications.  ASSESSMENT: Active Problems:   Vaginal bleeding in pregnancy, second trimester fetal bradycardia--? Positional vs. Related to bleeding and abruptio Constipation   PLAN: Continue inpatient x 7 days following bleeding Neonatology consult BMZ Stool softener  Reva Boresanya S Pratt, MD 06/16/2017,9:50 AM

## 2017-06-17 ENCOUNTER — Encounter (HOSPITAL_COMMUNITY): Payer: Self-pay | Admitting: *Deleted

## 2017-06-17 DIAGNOSIS — O4592 Premature separation of placenta, unspecified, second trimester: Principal | ICD-10-CM

## 2017-06-17 DIAGNOSIS — O4692 Antepartum hemorrhage, unspecified, second trimester: Secondary | ICD-10-CM

## 2017-06-17 DIAGNOSIS — Z3A23 23 weeks gestation of pregnancy: Secondary | ICD-10-CM

## 2017-06-17 NOTE — Progress Notes (Addendum)
Patient ID: Deanna MilletAyndaja L Perez, female   DOB: [birth year], 21 y.o.   MRN: [id number] FACULTY PRACTICE ANTEPARTUM(COMPREHENSIVE) NOTE  Deanna Perez is a 21 y.o. G1P0 at 4056w1d by best clinical estimate who is admitted for vaginal bleeding.   Fetal presentation is cephalic. Length of Stay:  2  Days  Subjective: No further bleeding. Has not been able to sleep due to continuous monitoring. Patient reports the fetal movement as active. Patient reports uterine contraction  activity as none. Patient reports  vaginal bleeding as none. Patient describes fluid per vagina as None.  Vitals:  Blood pressure 101/62, pulse 93, temperature 98.4 F (36.9 C), temperature source Oral, resp. rate 16, height 5' (1.524 m), weight 128 lb (58.1 kg), last menstrual period 12/30/2016, SpO2 99 %. Physical Examination:  General appearance - alert, well appearing, and in no distress Chest - normal effort Abdomen - gravid, NT Fundal Height:  size equals dates Extremities: Homans sign is negative, no sign of DVT  Membranes:intact  Fetal Monitoring:  Baseline: 150 bpm, Variability: Good {> 6 bpm), Accelerations: Non-reactive but appropriate for gestational age and Decelerations: Variable: moderate to severe at times with good recovery  Medications:  Scheduled . betamethasone acetate-betamethasone sodium phosphate  12 mg Intramuscular Q24 Hr x 2  . docusate sodium  100 mg Oral Daily  . prenatal multivitamin  1 tablet Oral Q1200   I have reviewed the patient's current medications.  ASSESSMENT: Active Problems:   Vaginal bleeding in pregnancy, second trimester   PLAN: Continue inpatient management x 7 days post bleed NICU consult OB u/s for growth tomorrow. Change to tid monitoring  Reva Boresanya S Aldena Worm, MD 06/17/2017,9:15 AM

## 2017-06-18 ENCOUNTER — Inpatient Hospital Stay (HOSPITAL_COMMUNITY): Payer: BLUE CROSS/BLUE SHIELD

## 2017-06-18 LAB — VITAMIN B12: Vitamin B-12: 335 pg/mL (ref 180–914)

## 2017-06-18 LAB — CBC
HEMATOCRIT: 27.3 % — AB (ref 36.0–46.0)
Hemoglobin: 9.2 g/dL — ABNORMAL LOW (ref 12.0–15.0)
MCH: 29.3 pg (ref 26.0–34.0)
MCHC: 33.7 g/dL (ref 30.0–36.0)
MCV: 86.9 fL (ref 78.0–100.0)
Platelets: 221 10*3/uL (ref 150–400)
RBC: 3.14 MIL/uL — AB (ref 3.87–5.11)
RDW: 14 % (ref 11.5–15.5)
WBC: 17.1 10*3/uL — AB (ref 4.0–10.5)

## 2017-06-18 LAB — IRON AND TIBC
Iron: 39 ug/dL (ref 28–170)
SATURATION RATIOS: 8 % — AB (ref 10.4–31.8)
TIBC: 477 ug/dL — AB (ref 250–450)
UIBC: 438 ug/dL

## 2017-06-18 LAB — TYPE AND SCREEN
ABO/RH(D): A POS
Antibody Screen: NEGATIVE

## 2017-06-18 LAB — RETICULOCYTES
RBC.: 3.14 MIL/uL — AB (ref 3.87–5.11)
RETIC CT PCT: 2.8 % (ref 0.4–3.1)
Retic Count, Absolute: 87.9 10*3/uL (ref 19.0–186.0)

## 2017-06-18 LAB — FERRITIN: Ferritin: 6 ng/mL — ABNORMAL LOW (ref 11–307)

## 2017-06-18 LAB — FOLATE: Folate: 8.7 ng/mL (ref >5.9)

## 2017-06-18 NOTE — Progress Notes (Addendum)
   06/18/17 2348  Vital Signs  BP (!) 112/55  BP Location Right Arm  Patient Position (if appropriate) Supine  BP Method Automatic  Pulse Rate 99  Pulse Rate Source Monitor  Resp 16  Oxygen Therapy  SpO2 100 %  O2 Device Room Air  As I rounded on my patient, she  reported to me that at approximately 2335 hrs she fell while getting out of the shower. Pt stated that her feet were wet and as she step onto the wooden floor she slid and fell on her buttocks. Pt denies pain, denies hitting her head or suffered any kind of discomfort at this time.  Her bony prominences  were examined and was found to be negative for bruises  or tenderness. Dr Vergie Living was called and informed. No new orders received.NST initiated FHR 150. We will continue to monitor.

## 2017-06-18 NOTE — Progress Notes (Signed)
Patient ID: Deanna Perez, female   DOB: 10-20-1996, 21 y.o.   MRN: [id number] FACULTY PRACTICE ANTEPARTUM(COMPREHENSIVE) NOTE  LAVONDRA HAUSSER is a 21 y.o. G1P0 at 107w2d by best clinical estimate who is admitted for 2nd trimester bleeding.   Fetal presentation is cephalic. Length of Stay:  3  Days  Subjective: No further bleeding Patient reports the fetal movement as active. Patient reports uterine contraction  activity as none. Patient reports  vaginal bleeding as none. Patient describes fluid per vagina as None.  Vitals:  Blood pressure (!) 105/53, pulse 88, temperature 98.6 F (37 C), temperature source Oral, resp. rate 18, height 5' (1.524 m), weight 128 lb (58.1 kg), last menstrual period 12/30/2016, SpO2 98 %. Physical Examination:  General appearance - alert, well appearing, and in no distress Chest - normal effort Abdomen - gravid, NT Fundal Height:  size equals dates Extremities: Homans sign is negative, no sign of DVT  Membranes:intact  Fetal Monitoring:  Baseline: 145 bpm, Variability: Good {> 6 bpm), Accelerations: Non-reactive but appropriate for gestational age and Decelerations: Variable: moderate x 3    Medications:  Scheduled . docusate sodium  100 mg Oral Daily  . prenatal multivitamin  1 tablet Oral Q1200   I have reviewed the patient's current medications.  ASSESSMENT: Active Problems:   Vaginal bleeding in pregnancy, second trimester   Abruptio placenta, second trimester   PLAN: Continue inpt. Monitoring x 7 days For u/s for growth today  Reva Bores, MD 06/18/2017,8:03 AM

## 2017-06-19 DIAGNOSIS — O99012 Anemia complicating pregnancy, second trimester: Secondary | ICD-10-CM | POA: Diagnosis present

## 2017-06-19 MED ORDER — FERROUS GLUCONATE 324 (38 FE) MG PO TABS
324.0000 mg | ORAL_TABLET | Freq: Two times a day (BID) | ORAL | Status: DC
  Administered 2017-06-19 – 2017-06-23 (×9): 324 mg via ORAL
  Filled 2017-06-19 (×9): qty 1

## 2017-06-19 NOTE — Progress Notes (Signed)
   06/19/17 0335  Vital Signs  BP (!) 92/45  BP Location Right Arm  Patient Position (if appropriate) Supine  BP Method Automatic  Pulse Rate 84  Pulse Rate Source Monitor  Resp 18  Oxygen Therapy  SpO2 98 %  O2 Device Room Air  Patient seen asleep, easily arose, denies vaginal bleed,  pain or discomfort. We will continue to monitor

## 2017-06-19 NOTE — Progress Notes (Addendum)
Daily Antepartum Note  Admission Date: 06/14/2017 Current Date: 06/19/2017 7:08 AM  Deanna Perez is a 21 y.o. G1 @ [redacted]w[redacted]d, HD#5, admitted for vaginal bleeding.  Pregnancy complicated by: Patient Active Problem List   Diagnosis Date Noted  . Anemia in pregnancy, second trimester 06/19/2017  . Vaginal bleeding in pregnancy, second trimester 06/15/2017  . Supervision of normal pregnancy, antepartum 03/10/2017    Overnight/24hr events:  [name] in the shower (slipped) but only hit her butt  Subjective:  No s/s of decreased FM, VB or LOF  Objective:    Current Vital Signs 24h Vital Sign Ranges  T 98 F (36.7 C) Temp  Avg: 98.4 F (36.9 C)  Min: 98 F (36.7 C)  Max: 98.8 F (37.1 C)  BP (!) 96/47 BP  Min: 92/45  Max: 112/55  HR 84 Pulse  Avg: 89.7  Min: 84  Max: 99  RR 18 Resp  Avg: 16.9  Min: 14  Max: 18  SaO2 99 % Not Delivered SpO2  Avg: 99.3 %  Min: 98 %  Max: 100 %       24 Hour I/O Current Shift I/O  Time Ins Outs No intake/output data recorded. No intake/output data recorded.   FHT 145 baseline +acels, no decel, mod variability, toco quiet x 1 hr 150 baseline +acels, no decel, mod variability, toco with irritability x 1 hr 150 baseline +acels, no decel, mod variability, toco with rare irritability x 1 hr  Physical exam: General: Well nourished, well developed female in no acute distress. Abdomen: gravid, nttp Cardiovascular: S1, S2 normal, no murmur, rub or gallop, regular rate and rhythm Respiratory: CTAB Extremities: no clubbing, cyanosis or edema Skin: Warm and dry.  SVE: deferred. C/l/hi on admit  Medications: Current Facility-Administered Medications  Medication Dose Route Frequency Provider Last Rate Last Dose  . acetaminophen (TYLENOL) tablet 650 mg  650 mg Oral Q4H PRN Constant, Peggy, MD   650 mg at 06/16/17 0826  . bisacodyl (DULCOLAX) suppository 10 mg  10 mg Rectal Daily PRN Reva Bores, MD      . calcium carbonate (TUMS - dosed in mg elemental  calcium) chewable tablet 400 mg of elemental calcium  2 tablet Oral Q4H PRN Constant, Peggy, MD      . docusate sodium (COLACE) capsule 100 mg  100 mg Oral Daily Constant, Peggy, MD   100 mg at 06/18/17 1044  . ferrous gluconate (FERGON) tablet 324 mg  324 mg Oral BID WC [place] Bing, MD      . prenatal multivitamin tablet 1 tablet  1 tablet Oral Q1200 Constant, Peggy, MD   1 tablet at 06/18/17 1044    Labs:   Recent Labs Lab 06/15/17 0134 06/18/17 0936  WBC 11.3* 17.1*  HGB 9.4* 9.2*  HCT 28.2* 27.3*  PLT 230 221   A POS  Radiology:  8/17: frank breech, 53%, 688gm, AC @ 60%, CL 3cm, normal MVP. Normal placenta  Assessment & Plan:  Pt doing well *Pregnancy: routine care. Bid NSTs. Prenatal vitamin *VB: last bleed was on 8/15. Keep one week s/p last bleed  *Preterm: no issues. S/p BMZ on 8/15-16. Consult nicu PRN *Anemia: c/w iron def anemia. Bid iron and BM regimen added. Pt asymptomatic.  *PPx: SCDs, OOB ad lib *FEN/GI: regular diet *Dispo: see above.   Cornelia Copa MD Attending Center for [facility])

## 2017-06-19 NOTE — Progress Notes (Signed)
   06/19/17 0115  Vital Signs  BP (!) 103/58  BP Location Right Arm  Patient Position (if appropriate) Supine  BP Method Automatic  Pulse Rate 89  Pulse Rate Source Monitor  Resp 16  Oxygen Therapy  SpO2 99 %  O2 Device Room Air  Patient oriented x 4, resting comfortable in bed, denies pain or discomfort, denies vaginal bleed. We will reassess in two hours.

## 2017-06-20 NOTE — Progress Notes (Signed)
   06/20/17 2218  Vital Signs  BP 95/61  BP Location Right Arm  Patient Position (if appropriate) Semi-fowlers  Pulse Rate 76  Pulse Rate Source Monitor  Resp 16  Temp 98.5 F (36.9 C)  Temp Source Oral  Oxygen Therapy  SpO2 100 %  O2 Device Room Air  Writer responded to patient's emergency bathroom call light. We found patient sitting on the toilet C/O of "feeling dizzy". Pt stated that she thinks her " shower was too hot" and the heat made her" feel sick". She drank some water and we assisted her back to bed. .Pt settled comfortable in bed, FHT 140's

## 2017-06-20 NOTE — Progress Notes (Signed)
Provider notified of strip, MD ok with taking pt off monitor

## 2017-06-20 NOTE — Progress Notes (Signed)
   06/20/17 2326  Vital Signs  BP 98/66  BP Location Right Arm  Patient Position (if appropriate) Semi-fowlers  BP Method Automatic  Pulse Rate 89  Pulse Rate Source Monitor  Resp 18  Oxygen Therapy  SpO2 98 %  O2 Device Room Air  Pt remained in bed, denies dizziness  and reported "feeling better". We will continue to monitor.

## 2017-06-20 NOTE — Progress Notes (Signed)
FACULTY PRACTICE ANTEPARTUM(COMPREHENSIVE) NOTE  Deanna Perez is a 21 y.o. G1P0 at [redacted]w[redacted]d who is admitted for vaginal bleeding.   Fetal presentation is breech. Length of Stay:  5  Days  Subjective:   Patient reports the fetal movement as active. Patient reports uterine contraction  activity as none. Patient reports  vaginal bleeding as none. Patient describes fluid per vagina as None.  Vitals:  Blood pressure (!) 103/53, pulse 85, temperature 98.3 F (36.8 C), temperature source Oral, resp. rate 16, height 5' (1.524 m), weight 128 lb (58.1 kg), last menstrual period 12/30/2016, SpO2 100 %. Physical Examination:  General appearance - alert, well appearing, and in no distress Heart - normal rate and regular rhythm Abdomen - soft, nontender, nondistended Fundal Height:  size equals dates. Extremities: extremities normal, atraumatic, no cyanosis or edema and Homans sign is negative, no sign of DVT  Membranes:intact  Fetal Monitoring:   Fetal Heart Rate A  Mode External filed at 06/19/2017 2252  Baseline Rate (A) 150 bpm filed at 06/19/2017 2252  Variability 6-25 BPM filed at 06/19/2017 2252  Accelerations 10 x 10, 15 x 15 filed at 06/19/2017 2252  Decelerations None filed at 06/19/2017 2252     Labs:  No results found for this or any previous visit (from the past 24 hour(s)).  Imaging Studies:     Currently EPIC will not allow sonographic studies to automatically populate into notes.  In the meantime, copy and paste results into note or free text.  Medications:  Scheduled . docusate sodium  100 mg Oral Daily  . ferrous gluconate  324 mg Oral BID WC  . prenatal multivitamin  1 tablet Oral Q1200   I have reviewed the patient's current medications.  ASSESSMENT: Patient Active Problem List   Diagnosis Date Noted  . Anemia in pregnancy, second trimester 06/19/2017  . Vaginal bleeding in pregnancy, second trimester 06/15/2017  . Supervision of normal pregnancy, antepartum  03/10/2017    PLAN: Expectant management in hospital for now  Scheryl Darter 06/20/2017,10:36 AM

## 2017-06-21 LAB — TYPE AND SCREEN
ABO/RH(D): A POS
ANTIBODY SCREEN: NEGATIVE

## 2017-06-21 NOTE — Progress Notes (Signed)
Patient ID: Deanna Perez, female   DOB: 1996-04-30, 21 y.o.   MRN: [id number] FACULTY PRACTICE ANTEPARTUM(COMPREHENSIVE) NOTE  Deanna Perez is a 21 y.o. G1P0 at [redacted]w[redacted]d by best clinical estimate who is admitted for 2nd trimester bleeding.   Fetal presentation is breech. Length of Stay:  6  Days  Subjective: No further bleeding since last Wednesday. She is having cramping now occasionally Patient reports the fetal movement as active. Patient reports uterine contraction  activity as none. Patient reports  vaginal bleeding as none. Patient describes fluid per vagina as None.  Vitals:  Blood pressure 102/70, pulse 82, temperature 98.5 F (36.9 C), temperature source Oral, resp. rate 18, height 5' (1.524 m), weight 128 lb (58.1 kg), last menstrual period 12/30/2016, SpO2 100 %. Physical Examination:  General appearance - alert, well appearing, and in no distress Chest - normal effort Abdomen - gravid, non-tender Fundal Height:  size equals dates Extremities: Homans sign is negative, no sign of DVT  Membranes:intact  Fetal Monitoring:  Baseline: 150 bpm, Variability: Good {> 6 bpm), Accelerations: Non-reactive but appropriate for gestational age and Decelerations: Variable: mild/moderate x 3  Imaging Studies:   Breech, AFI WNL, EFW 688 gm 53%  Medications:  Scheduled . docusate sodium  100 mg Oral Daily  . ferrous gluconate  324 mg Oral BID WC  . prenatal multivitamin  1 tablet Oral Q1200   I have reviewed the patient's current medications.  ASSESSMENT: Active Problems:   Vaginal bleeding in pregnancy, second trimester   Anemia in pregnancy, second trimester   PLAN: Continue inpatient stay x 7 days of no bleeding. Anticipate DC this Wednesday if all goes well. If further cramping/bleeding-->inform nursing staff.  Reva Bores, MD 06/21/2017,9:55 AM

## 2017-06-22 NOTE — Progress Notes (Signed)
Called by nursing regarding current q shift nst. 1 minute decel noted. No bleeding, no abdominal pain. NST reviewed. 150/mod/+a/one decel, appears to be variable, though poor quality tracing during decel. Will continue to monitor for another 2 hours, if tracing reassuring and no vaginal bleeding or uterine cramping, will resume q shift nst.

## 2017-06-22 NOTE — Progress Notes (Signed)
Patient ID: Deanna Perez, female   DOB: 04/24/1996, 21 y.o.   MRN: [id number] FACULTY PRACTICE ANTEPARTUM(COMPREHENSIVE) NOTE  Deanna Perez is a 21 y.o. G1P0 at [redacted]w[redacted]d by best clinical estimate who is admitted for second trimester bleeding.   Fetal presentation is breech. Length of Stay:  7  Days  Subjective: Doing well, no bleeding Patient reports the fetal movement as active. Patient reports uterine contraction  activity as none. Patient reports  vaginal bleeding as none. Patient describes fluid per vagina as None.  Vitals:  Blood pressure (!) 87/50, pulse 80, temperature 98.4 F (36.9 C), temperature source Oral, resp. rate 18, height 5' (1.524 m), weight 128 lb (58.1 kg), last menstrual period 12/30/2016, SpO2 99 %. Physical Examination:  General appearance - alert, well appearing, and in no distress Chest - normal effort Abdomen - gravid, NT Fundal Height:  size equals dates Extremities: Homans sign is negative, no sign of DVT  Membranes:intact  Fetal Monitoring:  Baseline: 145 bpm, Variability: Good {> 6 bpm), Accelerations: Non-reactive but appropriate for gestational age and Decelerations: Variable: moderate x 3  Medications:  Scheduled . docusate sodium  100 mg Oral Daily  . ferrous gluconate  324 mg Oral BID WC  . prenatal multivitamin  1 tablet Oral Q1200   I have reviewed the patient's current medications.  ASSESSMENT: Active Problems:   Vaginal bleeding in pregnancy, second trimester   Anemia in pregnancy, second trimester   PLAN: Stable for now, s/p BMZ x 2 for d/c tomorrow if no further bleeding.  Reva Bores, MD 06/22/2017,10:22 AM

## 2017-06-22 NOTE — Consult Note (Signed)
Neonatology Consultation Requested by: Kennon Rounds Reason: [redacted] weeks EGA, vaginal bleeding  I met with Deanna Perez today and discussed the risks of preterm delivery at 25 weeks, mainly the respiratory, infectious, and CNS morbidities, and also the beneficial effects of the betamethasone she is receiving.  We spoke about the possible needs for prolonged IV access and mechanical ventilation, and the risks for long term impairment of cognitiive abilities in ELBW premature babies.  She is hopeful of continuing the pregnancy for as long as possible.  I told her to call me back if she or family members had additional questions before she is discharged.  Vienna Folden L. Patterson Hammersmith, M.D.

## 2017-06-23 NOTE — Progress Notes (Signed)
Discharge teaching complete with pt. Pt understood all information and did not have any questions. Pt pushed via wheelchair out of the hospital and discharged home to family.

## 2017-06-23 NOTE — Discharge Summary (Signed)
Antenatal Physician Discharge Summary  Patient ID: Deanna Perez MRN: [id number] DOB/AGE: 1995/11/12 21 y.o.  Admit date: 06/14/2017 Discharge date: 06/23/2017  Admission Diagnoses: Active Problems:   Vaginal bleeding in pregnancy, second trimester   Anemia in pregnancy, second trimester  Discharge Diagnoses: Same  Prenatal Procedures: NST and ultrasound  Consults: Neonatology, Maternal Fetal Medicine  Hospital Course:  This is a 21 y.o. G1P0 with IUP at [redacted]w[redacted]d admitted for vaginal bleeding, presumed placental abruption. She was admitted with bleeding noted to have a cervical exam of Dilation: Closed Effacement (%): 40 Exam by:: julie wenzel  PA .  No leaking of fluid.  She was initially started on magnesium sulfate for tocolysis and neuroprotection and also received betamethasone x 2 doses. She was observed, fetal heart rate monitoring remained reassuring though she had few moderate variable. U/s revealed normal fluid and normal growthShe was monitored for 7 days with no further bleeding. She was deemed stable for discharge to home with outpatient follow up.  Discharge Exam: Temp:  [98.4 F (36.9 C)] 98.4 F (36.9 C) (08/21 2000) Pulse Rate:  [89-101] 97 (08/21 2000) Resp:  [18] 18 (08/21 2000) BP: (100-110)/(62-66) 110/63 (08/21 2000) SpO2:  [99 %-100 %] 100 % (08/21 2000) Physical Examination: CONSTITUTIONAL: Well-developed, well-nourished female in no acute distress.  HENT:  Normocephalic, atraumatic, External right and left ear normal.  EYES: Conjunctivae and EOM are normal.  No scleral icterus.  NECK: Normal range of motion, supple, no masses SKIN: Skin is warm and dry. NEUROLGIC: Alert and oriented to person, place, and time. Marland Kitchen PSYCHIATRIC: Normal mood and affect. Marland Kitchen CARDIOVASCULAR: Normal heart rate noted, regular rhythm RESPIRATORY: Effort and breath sounds normal MUSCULOSKELETAL: Normal range of motion. No edema and no tenderness. 2+ distal pulses. ABDOMEN: Soft,  nontender, nondistended, gravid.  Fetal monitoring: FHR: 145 bpm, Variability: moderate, Accelerations: Present, Decelerations: moderate variables x 3  Uterine activity: 0 contractions per hour  Significant Diagnostic Studies:  Results for orders placed or performed during the hospital encounter of 06/14/17 (from the past 168 hour(s))  Type and screen [facility] [place]   Collection Time: 06/18/17  7:15 AM  Result Value Ref Range   ABO/RH(D) A POS    Antibody Screen NEG    Sample Expiration 06/21/2017   Ferritin   Collection Time: 06/18/17  9:36 AM  Result Value Ref Range   Ferritin 6 (L) 11 - 307 ng/mL  CBC   Collection Time: 06/18/17  9:36 AM  Result Value Ref Range   WBC 17.1 (H) 4.0 - 10.5 K/uL   RBC 3.14 (L) 3.87 - 5.11 MIL/uL   Hemoglobin 9.2 (L) 12.0 - 15.0 g/dL   HCT 19.1 (L) 47.8 - 29.5 %   MCV 86.9 78.0 - 100.0 fL   MCH 29.3 26.0 - 34.0 pg   MCHC 33.7 30.0 - 36.0 g/dL   RDW 62.1 30.8 - 65.7 %   Platelets 221 150 - 400 K/uL  Iron and TIBC   Collection Time: 06/18/17  9:36 AM  Result Value Ref Range   Iron 39 28 - 170 ug/dL   TIBC 846 (H) 962 - 952 ug/dL   Saturation Ratios 8 (L) 10.4 - 31.8 %   UIBC 438 ug/dL  Reticulocytes   Collection Time: 06/18/17  9:36 AM  Result Value Ref Range   Retic Ct Pct 2.8 0.4 - 3.1 %   RBC. 3.14 (L) 3.87 - 5.11 MIL/uL   Retic Count, Absolute 87.9 19.0 - 186.0 K/uL  Vitamin B12   Collection Time: 06/18/17  9:36 AM  Result Value Ref Range   Vitamin B-12 335 180 - 914 pg/mL  Folate   Collection Time: 06/18/17  9:37 AM  Result Value Ref Range   Folate 8.7 >5.9 ng/mL  Type and screen [facility] [place]   Collection Time: 06/21/17  6:17 AM  Result Value Ref Range   ABO/RH(D) A POS    Antibody Screen NEG    Sample Expiration 06/24/2017     Discharge Condition: Stable  Disposition: Final discharge disposition not confirmed   Discharge Instructions    Discharge activity:  No Restrictions    Complete  by:  As directed    Discharge diet:  No restrictions    Complete by:  As directed    Do not have sex or do anything that might make you have an orgasm    Complete by:  As directed    Fetal Kick Count:  Lie on our left side for one hour after a meal, and count the number of times your baby kicks.  If it is less than 5 times, get up, move around and drink some juice.  Repeat the test 30 minutes later.  If it is still less than 5 kicks in an hour, notify your doctor.    Complete by:  As directed    Notify physician for a general feeling that "something is not right"    Complete by:  As directed    Notify physician for increase or change in vaginal discharge    Complete by:  As directed    Notify physician for intestinal cramps, with or without diarrhea, sometimes described as "gas pain"    Complete by:  As directed    Notify physician for leaking of fluid    Complete by:  As directed    Notify physician for low, dull backache, unrelieved by heat or Tylenol    Complete by:  As directed    Notify physician for menstrual like cramps    Complete by:  As directed    Notify physician for pelvic pressure    Complete by:  As directed    Notify physician for uterine contractions.  These may be painless and feel like the uterus is tightening or the baby is  "balling up"    Complete by:  As directed    Notify physician for vaginal bleeding    Complete by:  As directed    PRETERM LABOR:  Includes any of the follwing symptoms that occur between 20 - [redacted] weeks gestation.  If these symptoms are not stopped, preterm labor can result in preterm delivery, placing your baby at risk    Complete by:  As directed      Allergies as of 06/23/2017   No Known Allergies     Medication List    STOP taking these medications   cephALEXin 500 MG capsule Commonly known as:  KEFLEX   metroNIDAZOLE 500 MG tablet Commonly known as:  FLAGYL     TAKE these medications   Doxylamine-Pyridoxine 10-10 MG Tbec Take 2  tabs at bedtime. If symptoms persist after 2 days, take 1 tab in the morning and 2 tabs at bedtime   PRENATAL VITAMIN PO Take 1 tablet by mouth daily.            Discharge Care Instructions        Start     Ordered   06/23/17 0000  Fetal Kick Count:  Lie on our left side for one hour after a meal, and count the number of times your baby kicks.  If it is less than 5 times, get up, move around and drink some juice.  Repeat the test 30 minutes later.  If it is still less than 5 kicks in an hour, notify your doctor.     06/23/17 0936   06/23/17 0000  Discharge diet:  No restrictions     06/23/17 0936   06/23/17 0000  PRETERM LABOR:  Includes any of the follwing symptoms that occur between 20 - [redacted] weeks gestation.  If these symptoms are not stopped, preterm labor can result in preterm delivery, placing your baby at risk  (Preterm Labor Notify MD)     06/23/17 0936   06/23/17 0000  Notify physician for menstrual like cramps  (Preterm Labor Notify MD)     06/23/17 1610   06/23/17 0000  Notify physician for uterine contractions.  These may be painless and feel like the uterus is tightening or the baby is  "balling up"  (Preterm Labor Notify MD)     06/23/17 0936   06/23/17 0000  Notify physician for low, dull backache, unrelieved by heat or Tylenol  (Preterm Labor Notify MD)     06/23/17 0936   06/23/17 0000  Notify physician for intestinal cramps, with or without diarrhea, sometimes described as "gas pain"  (Preterm Labor Notify MD)     06/23/17 0936   06/23/17 0000  Notify physician for pelvic pressure  (Preterm Labor Notify MD)     06/23/17 0936   06/23/17 0000  Notify physician for increase or change in vaginal discharge  (Preterm Labor Notify MD)     06/23/17 0936   06/23/17 0000  Notify physician for vaginal bleeding  (Preterm Labor Notify MD)     06/23/17 0936   06/23/17 0000  Notify physician for a general feeling that "something is not right"  (Preterm Labor Notify MD)      06/23/17 0936   06/23/17 0000  Notify physician for leaking of fluid  (Preterm Labor Notify MD)     06/23/17 0936   06/23/17 0000  Do not have sex or do anything that might make you have an orgasm     06/23/17 0936   06/23/17 0000  Discharge activity:  No Restrictions     06/23/17 0936     Follow-up Information    [facility] Follow up in 1 week(s).   Specialty:  Obstetrics and Gynecology Why:  They will call with appointment Contact information: [address] [place] [address] [phone]          Signed: Reva Bores M.D. 06/23/2017, 9:38 AM

## 2017-06-23 NOTE — Discharge Instructions (Signed)
Placental Abruption °Placental abruption is a condition in which the placenta partly or completely separates from the uterus before the baby is born. The placenta is the organ that nourishes the unborn baby (fetus). The baby gets his or her blood supply and nutrients through the placenta. It is the baby’s life support system. The placenta is attached to the inside of the uterus until after the baby is born. °Placental abruption is rare, but it can happen any time after 20 weeks of pregnancy. A small separation may not cause problems, but a large separation may be dangerous for you and your baby. A large separation is usually an emergency. It requires treatment right away. °What are the causes? °In most cases, the cause of this condition is not known. °What increases the risk? °This condition is more likely to develop in women who: °· Have experienced a recent trauma such as a fall, an abdominal injury, or a car accident. °· Have a previous placental abruption. °· Have high blood pressure (hypertension). °· Smoke cigarettes, use alcohol, or use illegal drugs such as cocaine. °· Have blood clotting problems. °· Experience preterm premature rupture of membranes (PPROM). °· Have multiples (twins, triplets, or more). °· Have had children before. °· Are 35 years of age or older. ° °What are the signs or symptoms? °Symptoms of this condition can vary from mild to severe. °A small placental abruption may not cause symptoms, or it may cause mild symptoms, which may include: °· Mild abdominal pain or lower back pain. °· Slight vaginal bleeding. ° °A severe placental abruption will cause symptoms. The symptoms will depend on the size of the separation and the stage of pregnancy. They may include: °· Abdominal pain or lower back pain. °· Vaginal bleeding. °· Tender and hard uterus. °· Severe abdominal pain with tenderness. °· Continual contractions of your uterus. °· Weakness and light-headedness. ° °How is this  diagnosed? °This condition may be diagnosed based on: °· Your symptoms. °· A physical exam. °· Ultrasound. °· Blood work. This will be done to make sure that there are enough healthy red blood cells and that there are no clotting problems or signs of too much blood loss. ° °How is this treated? °Treatment for placental abruption depends on the severity of the condition. °For mild cases, treatment may involve monitoring your condition and managing your symptoms. This may involve: °· Bed rest and close observation. ° °For more severe cases, emergency treatment is needed. This may involve: °· Staying in the hospital until you and your baby are stabilized. °· Cesarean delivery of your baby. °· A blood transfusion or other fluids given through an IV tube. °· Other treatments, depending on: °? The amount of bleeding you have. °? Whether you or your baby are in distress. °? The stage of your pregnancy. °? The maturity of the baby. ° °Follow these instructions at home: °· Take over-the-counter and prescription medicines only as told by your health care provider. Do not take any medicines that your health care provider has not approved. °· Arrange for help at home before and after you deliver your baby, especially if you had a cesarean delivery or if you lost a lot of blood. °· Get plenty of rest and sleep. °· Do not use illegal drugs. °· Do not drink alcohol. °· Do not have sexual intercourse until your health care provider says it is okay. °· Do not use tampons or douche unless your health care provider says it is okay. °·   Do not use any products that contain nicotine or tobacco, such as cigarettes and e-cigarettes. If you need help quitting, ask your health care provider. °Get help right away if: °· You have vaginal bleeding or spotting. °· You have any type of trauma, such as a fall, abdominal trauma, or a car accident. °· You have abdominal pain. °· You have continuous uterine contractions. °· You have a hard, tender  uterus. °· You do not feel the baby move, or the baby moves very little. °This information is not intended to replace advice given to you by your health care provider. Make sure you discuss any questions you have with your health care provider. °Document Released: 10/19/2005 Document Revised: 06/18/2016 Document Reviewed: 05/10/2016 °Elsevier Interactive Patient Education © 2018 Elsevier Inc[employer] ° °

## 2017-07-01 ENCOUNTER — Encounter: Payer: BLUE CROSS/BLUE SHIELD | Admitting: Obstetrics and Gynecology

## 2017-07-01 ENCOUNTER — Ambulatory Visit (INDEPENDENT_AMBULATORY_CARE_PROVIDER_SITE_OTHER): Payer: BLUE CROSS/BLUE SHIELD | Admitting: Obstetrics and Gynecology

## 2017-07-01 ENCOUNTER — Encounter: Payer: Self-pay | Admitting: Radiology

## 2017-07-01 ENCOUNTER — Other Ambulatory Visit: Payer: BLUE CROSS/BLUE SHIELD

## 2017-07-01 VITALS — BP 109/76 | HR 90 | Wt 129.6 lb

## 2017-07-01 DIAGNOSIS — Z3402 Encounter for supervision of normal first pregnancy, second trimester: Secondary | ICD-10-CM

## 2017-07-01 DIAGNOSIS — Z3400 Encounter for supervision of normal first pregnancy, unspecified trimester: Secondary | ICD-10-CM

## 2017-07-01 NOTE — Progress Notes (Signed)
Prenatal Visit Note Date: 07/01/2017 Clinic: [facility]  Subjective:  Deanna Perez is a 21 y.o. G1P0 at 6350w1d being seen today for ongoing prenatal care.  She is currently monitored for the following issues for this low-risk pregnancy and has Supervision of normal pregnancy, antepartum; Vaginal bleeding in pregnancy, second trimester; and Anemia in pregnancy, second trimester on her problem list.  Patient reports no complaints.   Contractions: Not present. Vag. Bleeding: None.  Movement: Present. Denies leaking of fluid.   The following portions of the patient's history were reviewed and updated as appropriate: allergies, current medications, past family history, past medical history, past social history, past surgical history and problem list. Problem list updated.  Objective:   Vitals:   07/01/17 0846  BP: 109/76  Pulse: 90  Weight: 129 lb 9.6 oz (58.8 kg)    Fetal Status: Fetal Heart Rate (bpm): 150s Fundal Height: 27 cm Movement: Present     General:  Alert, oriented and cooperative. Patient is in no acute distress.  Skin: Skin is warm and dry. No rash noted.   Cardiovascular: Normal heart rate noted  Respiratory: Normal respiratory effort, no problems with respiration noted  Abdomen: Soft, gravid, appropriate for gestational age. Pain/Pressure: Absent     Pelvic:  Cervical exam deferred        Extremities: Normal range of motion.  Edema: None  Mental Status: Normal mood and affect. Normal behavior. Normal judgment and thought content.   Urinalysis:      Assessment and Plan:  Pregnancy: G1P0 at 4250w1d  1. Encounter for supervision of normal first pregnancy in second trimester Routine care. Okay to go back to work - CBC - RPR - HIV antibody (with reflex) - Glucose Tolerance, 2 Hours w/1 Hour   Preterm labor symptoms and general obstetric precautions including but not limited to vaginal bleeding, contractions, leaking of fluid and fetal movement  were reviewed in detail with the patient. Please refer to After Visit Summary for other counseling recommendations.  Return in about 2 weeks (around 07/15/2017) for rob.   [place] BingPickens, Marti Acebo, MD

## 2017-07-02 LAB — HIV ANTIBODY (ROUTINE TESTING W REFLEX): HIV Screen 4th Generation wRfx: NONREACTIVE

## 2017-07-02 LAB — CBC
HEMATOCRIT: 31 % — AB (ref 34.0–46.6)
Hemoglobin: 10.2 g/dL — ABNORMAL LOW (ref 11.1–15.9)
MCH: 28.5 pg (ref 26.6–33.0)
MCHC: 32.9 g/dL (ref 31.5–35.7)
MCV: 87 fL (ref 79–97)
Platelets: 236 10*3/uL (ref 150–379)
RBC: 3.58 x10E6/uL — ABNORMAL LOW (ref 3.77–5.28)
RDW: 14.5 % (ref 12.3–15.4)
WBC: 8.4 10*3/uL (ref 3.4–10.8)

## 2017-07-02 LAB — GLUCOSE TOLERANCE, 2 HOURS W/ 1HR
Glucose, 1 hour: 99 mg/dL (ref 65–179)
Glucose, 2 hour: 73 mg/dL (ref 65–152)
Glucose, Fasting: 69 mg/dL (ref 65–91)

## 2017-07-02 LAB — RPR: RPR Ser Ql: NONREACTIVE

## 2017-07-14 ENCOUNTER — Ambulatory Visit (INDEPENDENT_AMBULATORY_CARE_PROVIDER_SITE_OTHER): Payer: BLUE CROSS/BLUE SHIELD | Admitting: Student

## 2017-07-14 VITALS — BP 112/76 | HR 86 | Wt 133.0 lb

## 2017-07-14 DIAGNOSIS — N898 Other specified noninflammatory disorders of vagina: Secondary | ICD-10-CM | POA: Insufficient documentation

## 2017-07-14 DIAGNOSIS — Z3400 Encounter for supervision of normal first pregnancy, unspecified trimester: Secondary | ICD-10-CM

## 2017-07-14 DIAGNOSIS — O9989 Other specified diseases and conditions complicating pregnancy, childbirth and the puerperium: Secondary | ICD-10-CM

## 2017-07-14 DIAGNOSIS — Z3403 Encounter for supervision of normal first pregnancy, third trimester: Secondary | ICD-10-CM

## 2017-07-14 MED ORDER — VALACYCLOVIR HCL 1 G PO TABS: 1000.0000 mg | ORAL_TABLET | Freq: Two times a day (BID) | ORAL | 3 refills | Status: DC

## 2017-07-14 MED ORDER — CEPHALEXIN 500 MG PO CAPS: 500.0000 mg | ORAL_CAPSULE | Freq: Three times a day (TID) | ORAL | 0 refills | Status: DC

## 2017-07-14 MED ORDER — LIDOCAINE 5 % EX OINT: 1.0000 "application " | TOPICAL_OINTMENT | CUTANEOUS | 0 refills | Status: DC | PRN

## 2017-07-14 NOTE — Patient Instructions (Signed)
Genital Herpes Genital herpes is a common sexually transmitted infection (STI) that is caused by a virus. The virus spreads from person to person through sexual contact. Infection can cause itching, blisters, and sores around the genitals or rectum. Symptoms may last several days and then go away This is called an outbreak. However, the virus remains in your body, so you may have more outbreaks in the future. The time between outbreaks varies and can be months or years. Genital herpes affects men and women. It is particularly concerning for pregnant women because the virus can be passed to the baby during delivery and can cause serious problems. Genital herpes is also a concern for people who have a weak disease-fighting (immune) system. What are the causes? This condition is caused by the herpes simplex virus (HSV) type 1 or type 2. The virus may spread through:  Sexual contact with an infected person, including vaginal, anal, and oral sex.  Contact with fluid from a herpes sore.  The skin. This means that you can get herpes from an infected partner even if he or she does not have a visible sore or does not know that he or she is infected. What increases the risk? You are more likely to develop this condition if:  You have sex with many partners.  You do not use latex condoms during sex. What are the signs or symptoms? Most people do not have symptoms (asymptomatic) or have mild symptoms that may be mistaken for other skin problems. Symptoms may include:  Small red bumps near the genitals, rectum, or mouth. These bumps turn into blisters and then turn into sores.  Flu-like symptoms, including:  Fever.  Body aches.  Swollen lymph nodes.  Headache.  Painful urination.  Pain and itching in the genital area or rectal area.  Vaginal discharge.  Tingling or shooting pain in the legs and buttocks. Generally, symptoms are more severe and last longer during the first (primary)  outbreak. Flu-like symptoms are also more common during the primary outbreak. How is this diagnosed? Genital herpes may be diagnosed based on:  A physical exam.  Your medical history.  Blood tests.  A test of a fluid sample (culture) from an open sore. How is this treated? There is no cure for this condition, but treatment with antiviral medicines that are taken by mouth (orally) can do the following:  Speed up healing and relieve symptoms.  Help to reduce the spread of the virus to sexual partners.  Limit the chance of future outbreaks, or make future outbreaks shorter.  Lessen symptoms of future outbreaks. Your health care provider may also recommend pain relief medicines, such as aspirin or ibuprofen. Follow these instructions at home: Sexual activity   Do not have sexual contact during active outbreaks.  Practice safe sex. Latex condoms and female condoms may help prevent the spread of the herpes virus. General instructions   Keep the affected areas dry and clean.  Take over-the-counter and prescription medicines only as told by your health care provider.  Avoid rubbing or touching blisters and sores. If you do touch blisters or sores:  Wash your hands thoroughly with soap and water.  Do not touch your eyes afterward.  To help relieve pain or itching, you may take the following actions as directed by your health care provider:  Apply a cold, wet cloth (cold compress) to affected areas 4-6 times a day.  Apply a substance that protects your skin and reduces bleeding (astringent).  Apply a   gel that helps relieve pain around sores (lidocaine gel).  Take a warm, shallow bath that cleans the genital area (sitz bath).  Keep all follow-up visits as told by your health care provider. This is important. How is this prevented?  Use condoms. Although anyone can get genital herpes during sexual contact, even with the use of a condom, a condom can provide some  protection.  Avoid having multiple sexual partners.  Talk with your sexual partner about any symptoms either of you may have. Also, talk with your partner about any history of STIs.  Get tested for STIs before you have sex. Ask your partner to do the same.  Do not have sexual contact if you have symptoms of genital herpes. Contact a health care provider if:  Your symptoms are not improving with medicine.  Your symptoms return.  You have new symptoms.  You have a fever.  You have abdominal pain.  You have redness, swelling, or pain in your eye.  You notice new sores on other parts of your body.  You are a woman and experience bleeding between menstrual periods.  You have had herpes and you become pregnant or plan to become pregnant. Summary  Genital herpes is a common sexually transmitted infection (STI) that is caused by the herpes simplex virus (HSV) type 1 or type 2.  These viruses are most often spread through sexual contact with an infected person.  You are more likely to develop this condition if you have sex with many partners or you have unprotected sex.  Most people do not have symptoms (asymptomatic) or have mild symptoms that may be mistaken for other skin problems. Symptoms occur as outbreaks that may happen months or years apart.  There is no cure for this condition, but treatment with oral antiviral medicines can reduce symptoms, reduce the chance of spreading the virus to a partner, prevent future outbreaks, or shorten future outbreaks. This information is not intended to replace advice given to you by your health care provider. Make sure you discuss any questions you have with your health care provider. Document Released: 10/16/2000 Document Revised: 09/18/2016 Document Reviewed: 09/18/2016 Elsevier Interactive Patient Education  2017 Elsevier Inc.  

## 2017-07-14 NOTE — Progress Notes (Signed)
   PRENATAL VISIT NOTE  Subjective:  Deanna Perez is a 21 y.o. G1P0 at 6625w0d being seen today for ongoing prenatal care.  She is currently monitored for the following issues for this low-risk pregnancy and has Supervision of normal pregnancy, antepartum; Vaginal bleeding in pregnancy, second trimester; Anemia in pregnancy, second trimester; and Vaginal lesion on her problem list.  Patient reports pain on her labia for the past two days. She thought it was an ingrown hair from where she shaved two weeks ago, but she two days  ago she suddenly started having pain. Denies tingling, burning or itching, states it just "hurts". Says she hasn't been sexual active in a while; does not know if her recent partner has herpes or not. .  Contractions: Not present. Vag. Bleeding: None.  Movement: Present. Denies leaking of fluid.   The following portions of the patient's history were reviewed and updated as appropriate: allergies, current medications, past family history, past medical history, past social history, past surgical history and problem list. Problem list updated.  Objective:   Vitals:   07/14/17 1642  BP: 112/76  Pulse: 86  Weight: 133 lb (60.3 kg)    Patient has raised erythematous fluctuante mass the size of a nickel at approximately 7 o'clock on her right perianal region. Area is tender to the touch; no pus or odor. It is difficult to tell if ingrown hair vs cluster of herpetic lesion. The lesion was difficult to de-roof for culture, no clear fluid drained with scraping.   Fetal Status: Fetal Heart Rate (bpm): 148 Fundal Height: 28 cm Movement: Present     General:  Alert, oriented and cooperative. Patient is in no acute distress.  Skin: Skin is warm and dry. No rash noted.   Cardiovascular: Normal heart rate noted  Respiratory: Normal respiratory effort, no problems with respiration noted  Abdomen: Soft, gravid, appropriate for gestational age.  Pain/Pressure: Present     Pelvic:  Cervical exam deferred        Extremities: Normal range of motion.  Edema: None  Mental Status:  Normal mood and affect. Normal behavior. Normal judgment and thought content.   Assessment and Plan:  Pregnancy: G1P0 at 3825w0d  1. Vaginal lesion Unclear if patient has ingrown hair, boil or primary herpetic lesion. Will treat with Keflex for staph infection, valtrex for herpes and notify patient of results of culture and change medication if necessary. If lesion is not herpetic and does not resolve with antibiotics, patient is to call [facility] for visit early next week for I and D with an MD. Lidocaine jelly prescribed for pain.  - Herpes simplex virus culture  2. Supervision of normal first pregnancy, antepartum No other ob-gyn complaints;   Preterm labor symptoms and general obstetric precautions including but not limited to vaginal bleeding, contractions, leaking of fluid and fetal movement were reviewed in detail with the patient. Please refer to After Visit Summary for other counseling recommendations.  Return in about 2 weeks (around 07/28/2017).   Deanna Perez, CNM

## 2017-07-16 LAB — HERPES SIMPLEX VIRUS CULTURE

## 2017-07-28 ENCOUNTER — Encounter: Payer: Self-pay | Admitting: Radiology

## 2017-07-28 ENCOUNTER — Ambulatory Visit (INDEPENDENT_AMBULATORY_CARE_PROVIDER_SITE_OTHER): Payer: BLUE CROSS/BLUE SHIELD | Admitting: Obstetrics and Gynecology

## 2017-07-28 VITALS — BP 114/75 | HR 111 | Wt 136.0 lb

## 2017-07-28 DIAGNOSIS — A6009 Herpesviral infection of other urogenital tract: Secondary | ICD-10-CM

## 2017-07-28 DIAGNOSIS — Z3400 Encounter for supervision of normal first pregnancy, unspecified trimester: Secondary | ICD-10-CM

## 2017-07-28 DIAGNOSIS — O98313 Other infections with a predominantly sexual mode of transmission complicating pregnancy, third trimester: Secondary | ICD-10-CM

## 2017-07-28 DIAGNOSIS — Z3403 Encounter for supervision of normal first pregnancy, third trimester: Secondary | ICD-10-CM

## 2017-07-28 DIAGNOSIS — A6000 Herpesviral infection of urogenital system, unspecified: Secondary | ICD-10-CM | POA: Insufficient documentation

## 2017-07-28 NOTE — Progress Notes (Signed)
   PRENATAL VISIT NOTE  Subjective:  Deanna Perez is a 21 y.o. G1P0 at [redacted]w[redacted]d being seen today for ongoing prenatal care.  She is currently monitored for the following issues for this low-risk pregnancy and has Supervision of normal pregnancy, antepartum; Vaginal bleeding in pregnancy, second trimester; Anemia in pregnancy, second trimester; Vaginal lesion; and Genital herpes affecting pregnancy in third trimester on her problem list.  Patient reports no complaints.  Contractions: Not present. Vag. Bleeding: None.  Movement: Present. Denies leaking of fluid.   The following portions of the patient's history were reviewed and updated as appropriate: allergies, current medications, past family history, past medical history, past social history, past surgical history and problem list. Problem list updated.  Objective:   Vitals:   07/28/17 1632  BP: 114/75  Pulse: (!) 111  Weight: 136 lb (61.7 kg)    Fetal Status: Fetal Heart Rate (bpm): 151 Fundal Height: 30 cm Movement: Present     General:  Alert, oriented and cooperative. Patient is in no acute distress.  Skin: Skin is warm and dry. No rash noted.   Cardiovascular: Normal heart rate noted  Respiratory: Normal respiratory effort, no problems with respiration noted  Abdomen: Soft, gravid, appropriate for gestational age.  Pain/Pressure: Absent     Pelvic: Cervical exam deferred        Extremities: Normal range of motion.  Edema: None  Mental Status:  Normal mood and affect. Normal behavior. Normal judgment and thought content.   Assessment and Plan:  Pregnancy: G1P0 at [redacted]w[redacted]d  1. Supervision of normal first pregnancy, antepartum Patient is doing well without complaints Patient remains undecided on contraception She is still looking for a pediatrician Patient desires flu and tdap vaccine next visit  2. Genital herpes affecting pregnancy in third trimester Reviewed results with the patient who is taking valtrex Advised to  continue prophylaxis dosing until delivery Patient doubts diagnosis and desires to be tested again. Discussed ideal time for testing is during active lesions  Preterm labor symptoms and general obstetric precautions including but not limited to vaginal bleeding, contractions, leaking of fluid and fetal movement were reviewed in detail with the patient. Please refer to After Visit Summary for other counseling recommendations.  Return in about 2 weeks (around 08/11/2017) for ROB, flu and Tdap.   Catalina Antigua, MD

## 2017-08-11 ENCOUNTER — Other Ambulatory Visit: Payer: Self-pay

## 2017-08-11 ENCOUNTER — Ambulatory Visit (INDEPENDENT_AMBULATORY_CARE_PROVIDER_SITE_OTHER): Payer: BLUE CROSS/BLUE SHIELD | Admitting: Student

## 2017-08-11 ENCOUNTER — Encounter: Payer: Self-pay | Admitting: Radiology

## 2017-08-11 VITALS — BP 111/67 | HR 102 | Wt 140.2 lb

## 2017-08-11 DIAGNOSIS — Z3483 Encounter for supervision of other normal pregnancy, third trimester: Secondary | ICD-10-CM

## 2017-08-11 DIAGNOSIS — O98313 Other infections with a predominantly sexual mode of transmission complicating pregnancy, third trimester: Secondary | ICD-10-CM

## 2017-08-11 DIAGNOSIS — Z23 Encounter for immunization: Secondary | ICD-10-CM | POA: Diagnosis not present

## 2017-08-11 DIAGNOSIS — E611 Iron deficiency: Secondary | ICD-10-CM

## 2017-08-11 DIAGNOSIS — Z3480 Encounter for supervision of other normal pregnancy, unspecified trimester: Secondary | ICD-10-CM

## 2017-08-11 DIAGNOSIS — A6009 Herpesviral infection of other urogenital tract: Secondary | ICD-10-CM

## 2017-08-11 MED ORDER — FERROUS SULFATE 325 (65 FE) MG PO TABS: 325.0000 mg | ORAL_TABLET | Freq: Every day | ORAL | Status: DC

## 2017-08-11 NOTE — Progress Notes (Signed)
TDAP AND FLU GIVEN TODAY

## 2017-08-11 NOTE — Patient Instructions (Signed)

## 2017-08-12 NOTE — Progress Notes (Signed)
   PRENATAL VISIT NOTE  Subjective:  Deanna Perez is a 21 y.o. G1P0 at [redacted]w[redacted]d being seen today for ongoing prenatal care.  She is currently monitored for the following issues for this low-risk pregnancy and has Supervision of normal pregnancy, antepartum; Vaginal bleeding in pregnancy, second trimester; Anemia in pregnancy, second trimester; Vaginal lesion; Genital herpes affecting pregnancy in third trimester; and Low iron on her problem list.  Patient reports no complaints.  Contractions: Not present.  .  Movement: Present. Denies leaking of fluid.   The following portions of the patient's history were reviewed and updated as appropriate: allergies, current medications, past family history, past medical history, past social history, past surgical history and problem list. Problem list updated.  Objective:   Vitals:   08/11/17 1600  BP: 111/67  Pulse: (!) 102  Weight: 140 lb 3.2 oz (63.6 kg)    Fetal Status: Fetal Heart Rate (bpm): 132 Fundal Height: 31 cm Movement: Present     General:  Alert, oriented and cooperative. Patient is in no acute distress.  Skin: Skin is warm and dry. No rash noted.   Cardiovascular: Normal heart rate noted  Respiratory: Normal respiratory effort, no problems with respiration noted  Abdomen: Soft, gravid, appropriate for gestational age.  Pain/Pressure: Absent     Pelvic: Cervical exam deferred        Extremities: Normal range of motion.  Edema: None  Mental Status:  Normal mood and affect. Normal behavior. Normal judgment and thought content.   Assessment and Plan:  Pregnancy: G1P0 at [redacted]w[redacted]d  1. Supervision of other normal pregnancy, antepartum - Flu Vaccine QUAD 36+ mos IM (Fluarix, Quad PF)  2. Genital herpes affecting pregnancy in third trimester Patient denies symptoms at this time.  3. Anemia: RX for Ferrous Sulfate given   Preterm labor symptoms and general obstetric precautions including but not limited to vaginal bleeding,  contractions, leaking of fluid and fetal movement were reviewed in detail with the patient. Please refer to After Visit Summary for other counseling recommendations.  Return in about 2 weeks (around 08/25/2017).  Marylene Land, CNM

## 2017-08-12 NOTE — Assessment & Plan Note (Signed)
Started on FeS04 BID

## 2017-08-25 ENCOUNTER — Encounter: Payer: Self-pay | Admitting: Radiology

## 2017-08-25 ENCOUNTER — Ambulatory Visit (INDEPENDENT_AMBULATORY_CARE_PROVIDER_SITE_OTHER): Payer: BLUE CROSS/BLUE SHIELD | Admitting: Student

## 2017-08-25 VITALS — BP 108/70 | HR 102 | Wt 141.4 lb

## 2017-08-25 DIAGNOSIS — E611 Iron deficiency: Secondary | ICD-10-CM

## 2017-08-25 DIAGNOSIS — O98313 Other infections with a predominantly sexual mode of transmission complicating pregnancy, third trimester: Secondary | ICD-10-CM

## 2017-08-25 DIAGNOSIS — Z3403 Encounter for supervision of normal first pregnancy, third trimester: Secondary | ICD-10-CM

## 2017-08-25 DIAGNOSIS — A6009 Herpesviral infection of other urogenital tract: Secondary | ICD-10-CM

## 2017-08-25 MED ORDER — FERROUS SULFATE 325 (65 FE) MG PO TABS: 325.0000 mg | ORAL_TABLET | Freq: Every day | ORAL | 3 refills | Status: DC

## 2017-08-25 NOTE — Patient Instructions (Signed)
Iron Deficiency Anemia, Adult Iron deficiency anemia is a condition in which the concentration of red blood cells or hemoglobin in the blood is below normal because of too little iron. Hemoglobin is a substance in red blood cells that carries oxygen to the body's tissues. When the concentration of red blood cells or hemoglobin is too low, not enough oxygen reaches these tissues. Iron deficiency anemia is usually long-lasting (chronic) and it develops over time. It may or may not cause symptoms. It is a common type of anemia. What are the causes? This condition may be caused by:  Not enough iron in the diet.  Blood loss caused by bleeding in the intestine.  Blood loss from a gastrointestinal condition like Crohn disease.  Frequent blood draws, such as from blood donation.  Abnormal absorption in the gut.  Heavy menstrual periods in women.  Cancers of the gastrointestinal system, such as colon cancer.  What are the signs or symptoms? Symptoms of this condition may include:  Fatigue.  Headache.  Pale skin, lips, and nail beds.  Poor appetite.  Weakness.  Shortness of breath.  Dizziness.  Cold hands and feet.  Fast or irregular heartbeat.  Irritability. This is more common in severe anemia.  Rapid breathing. This is more common in severe anemia.  Mild anemia may not cause any symptoms. How is this diagnosed? This condition is diagnosed based on:  Your medical history.  A physical exam.  Blood tests.  You may have additional tests to find the underlying cause of your anemia, such as:  Testing for blood in the stool (fecal occult blood test).  A procedure to see inside your colon and rectum (colonoscopy).  A procedure to see inside your esophagus and stomach (endoscopy).  A test in which cells are removed from bone marrow (bone marrow aspiration) or fluid is removed from the bone marrow to be examined (biopsy). This is rarely needed.  How is this  treated? This condition is treated by correcting the cause of your iron deficiency. Treatment may involve:  Adding iron-rich foods to your diet.  Taking iron supplements. If you are pregnant or breastfeeding, you may need to take extra iron because your normal diet usually does not provide the amount of iron that you need.  Increasing vitamin C intake. Vitamin C helps your body absorb iron. Your health care provider may recommend that you take iron supplements along with a glass of orange juice or a vitamin C supplement.  Medicines to make heavy menstrual flow lighter.  Surgery.  You may need repeat blood tests to determine whether treatment is working. Depending on the underlying cause, the anemia should be corrected within 2 months of starting treatment. If the treatment does not seem to be working, you may need more testing. Follow these instructions at home: Medicines  Take over-the-counter and prescription medicines only as told by your health care provider. This includes iron supplements and vitamins.  If you cannot tolerate taking iron supplements by mouth, talk with your health care provider about taking them through a vein (intravenously) or an injection into a muscle.  For the best iron absorption, you should take iron supplements when your stomach is empty. If you cannot tolerate them on an empty stomach, you may need to take them with food.  Do not drink milk or take antacids at the same time as your iron supplements. Milk and antacids may interfere with iron absorption.  Iron supplements can cause constipation. To prevent constipation, include fiber   in your diet as told by your health care provider. A stool softener may also be recommended. Eating and drinking  Talk with your health care provider before changing your diet. He or she may recommend that you eat foods that contain a lot of iron, such as: ? Liver. ? Low-fat (lean) beef. ? Breads and cereals that have iron  added to them (are fortified). ? Eggs. ? Dried fruit. ? Dark green, leafy vegetables.  To help your body use the iron from iron-rich foods, eat those foods at the same time as fresh fruits and vegetables that are high in vitamin C. Foods that are high in vitamin C include: ? Oranges. ? Peppers. ? Tomatoes. ? Mangoes.  Drinkenoughfluid to keep your urine clear or pale yellow. General instructions  Return to your normal activities as told by your health care provider. Ask your health care provider what activities are safe for you.  Practice good hygiene. Anemia can make you more prone to illness and infection.  Keep all follow-up visits as told by your health care provider. This is important. Contact a health care provider if:  You feel nauseous or you vomit.  You feel weak.  You have unexplained sweating.  You develop symptoms of constipation, such as: ? Having fewer than three bowel movements a week. ? Straining to have a bowel movement. ? Having stools that are hard, dry, or larger than normal. ? Feeling full or bloated. ? Pain in the lower abdomen. ? Not feeling relief after having a bowel movement. Get help right away if:  You faint. If this happens, do not drive yourself to the hospital. Call your local emergency services (911 in the [place]  You have chest pain.  You have shortness of breath that: ? Is severe. ? Gets worse with physical activity.  You have a rapid heartbeat.  You become light-headed when getting up from a sitting or lying down position. This information is not intended to replace advice given to you by your health care provider. Make sure you discuss any questions you have with your health care provider. Document Released: 10/16/2000 Document Revised: 07/08/2016 Document Reviewed: 07/08/2016 Elsevier Interactive Patient Education  2018 [employer]  

## 2017-08-26 NOTE — Progress Notes (Signed)
   PRENATAL VISIT NOTE  Subjective:  Deanna Perez is a 21 y.o. G1P0 at 1662w1d being seen today for ongoing prenatal care.  She is currently monitored for the following issues for this low-risk pregnancy and has Supervision of normal pregnancy, antepartum; Vaginal bleeding in pregnancy, second trimester; Anemia in pregnancy, second trimester; Genital herpes affecting pregnancy in third trimester; and Low iron on her problem list.  Patient reports no complaints.  Contractions: Not present. Vag. Bleeding: None.  Movement: Present. Denies leaking of fluid.   The following portions of the patient's history were reviewed and updated as appropriate: allergies, current medications, past family history, past medical history, past social history, past surgical history and problem list. Problem list updated.  Objective:   Vitals:   08/25/17 1620  BP: 108/70  Pulse: (!) 102  Weight: 141 lb 6.4 oz (64.1 kg)    Fetal Status: Fetal Heart Rate (bpm): 135 Fundal Height: 32 cm Movement: Present     General:  Alert, oriented and cooperative. Patient is in no acute distress.  Skin: Skin is warm and dry. No rash noted.   Cardiovascular: Normal heart rate noted  Respiratory: Normal respiratory effort, no problems with respiration noted  Abdomen: Soft, gravid, appropriate for gestational age.  Pain/Pressure: Absent     Pelvic: Cervical exam deferred        Extremities: Normal range of motion.  Edema: None  Mental Status:  Normal mood and affect. Normal behavior. Normal judgment and thought content.   Assessment and Plan:  Pregnancy: G1P0 at 4262w1d  1. Low iron Plans to start Iron today  2. Genital herpes affecting pregnancy in third trimester Taking valtrex daily, reports no signs of outbreaks.   Otherwise feeling well; discussed PP contraception again.  Preterm labor symptoms and general obstetric precautions including but not limited to vaginal bleeding, contractions, leaking of fluid and  fetal movement were reviewed in detail with the patient. Please refer to After Visit Summary for other counseling recommendations.  Return in about 2 weeks (around 09/08/2017).   [name], CNM

## 2017-09-08 ENCOUNTER — Ambulatory Visit (INDEPENDENT_AMBULATORY_CARE_PROVIDER_SITE_OTHER): Payer: BLUE CROSS/BLUE SHIELD | Admitting: Nurse Practitioner

## 2017-09-08 ENCOUNTER — Encounter: Payer: Self-pay | Admitting: Radiology

## 2017-09-08 ENCOUNTER — Other Ambulatory Visit (HOSPITAL_COMMUNITY)
Admission: RE | Admit: 2017-09-08 | Discharge: 2017-09-08 | Disposition: A | Discharge status: Home / Self Care | Payer: BLUE CROSS/BLUE SHIELD | Source: Ambulatory Visit | Attending: Nurse Practitioner | Admitting: Nurse Practitioner

## 2017-09-08 VITALS — BP 105/71 | HR 103 | Wt 145.4 lb

## 2017-09-08 DIAGNOSIS — Z3403 Encounter for supervision of normal first pregnancy, third trimester: Secondary | ICD-10-CM | POA: Diagnosis not present

## 2017-09-08 DIAGNOSIS — Z113 Encounter for screening for infections with a predominantly sexual mode of transmission: Secondary | ICD-10-CM | POA: Diagnosis not present

## 2017-09-08 MED ORDER — VALACYCLOVIR HCL 1 G PO TABS: 1000.0000 mg | ORAL_TABLET | Freq: Two times a day (BID) | ORAL | 3 refills | Status: DC

## 2017-09-08 NOTE — Patient Instructions (Addendum)
Return for weekly visits now. Get Valtrex at your pharmacy and take daily by the package instructions.   Braxton Hicks Contractions Contractions of the uterus can occur throughout pregnancy, but they are not always a sign that you are in labor. You may have practice contractions called Braxton Hicks contractions. These false labor contractions are sometimes confused with true labor. What are Deanna PeltonBraxton Hicks contractions? Braxton Hicks contractions are tightening movements that occur in the muscles of the uterus before labor. Unlike true labor contractions, these contractions do not result in opening (dilation) and thinning of the cervix. Toward the end of pregnancy (32-34 weeks), Braxton Hicks contractions can happen more often and may become stronger. These contractions are sometimes difficult to tell apart from true labor because they can be very uncomfortable. You should not feel embarrassed if you go to the hospital with false labor. Sometimes, the only way to tell if you are in true labor is for your health care provider to look for changes in the cervix. The health care provider will do a physical exam and may monitor your contractions. If you are not in true labor, the exam should show that your cervix is not dilating and your water has not broken. If there are no prenatal problems or other health problems associated with your pregnancy, it is completely safe for you to be sent home with false labor. You may continue to have Braxton Hicks contractions until you go into true labor. How can I tell the difference between true labor and false labor?  Differences ? False labor ? Contractions last 30-70 seconds.: Contractions are usually shorter and not as strong as true labor contractions. ? Contractions become very regular.: Contractions are usually irregular. ? Discomfort is usually felt in the top of the uterus, and it spreads to the lower abdomen and low back.: Contractions are often felt in the  front of the lower abdomen and in the groin. ? Contractions do not go away with walking.: Contractions may go away when you walk around or change positions while lying down. ? Contractions usually become more intense and increase in frequency.: Contractions get weaker and are shorter-lasting as time goes on. ? The cervix dilates and gets thinner.: The cervix usually does not dilate or become thin. Follow these instructions at home:  Take over-the-counter and prescription medicines only as told by your health care provider.  Keep up with your usual exercises and follow other instructions from your health care provider.  Eat and drink lightly if you think you are going into labor.  If Braxton Hicks contractions are making you uncomfortable: ? Change your position from lying down or resting to walking, or change from walking to resting. ? Sit and rest in a tub of warm water. ? Drink enough fluid to keep your urine clear or pale yellow. Dehydration may cause these contractions. ? Do slow and deep breathing several times an hour.  Keep all follow-up prenatal visits as told by your health care provider. This is important. Contact a health care provider if:  You have a fever.  You have continuous pain in your abdomen. Get help right away if:  Your contractions become stronger, more regular, and closer together.  You have fluid leaking or gushing from your vagina.  You pass blood-tinged mucus (bloody show).  You have bleeding from your vagina.  You have low back pain that you never had before.  You feel your baby's head pushing down and causing pelvic pressure.  Your baby  is not moving inside you as much as it used to. Summary  Contractions that occur before labor are called Braxton Hicks contractions, false labor, or practice contractions.  Braxton Hicks contractions are usually shorter, weaker, farther apart, and less regular than true labor contractions. True labor contractions  usually become progressively stronger and regular and they become more frequent.  Manage discomfort from [facility] contractions by changing position, resting in a warm bath, drinking plenty of water, or practicing deep breathing. This information is not intended to replace advice given to you by your health care provider. Make sure you discuss any questions you have with your health care provider. Document Released: 10/19/2005 Document Revised: 09/07/2016 Document Reviewed: 09/07/2016 Elsevier Interactive Patient Education  2017 [employer].

## 2017-09-08 NOTE — Progress Notes (Signed)
    Subjective:  Deanna Perez is a 21 y.o. G1P0 at 2791w0d being seen today for ongoing prenatal care.  She is currently monitored for the following issues for this low-risk pregnancy and has Encounter for supervision of normal first pregnancy in third trimester; Vaginal bleeding in pregnancy, second trimester; Anemia in pregnancy, second trimester; Genital herpes affecting pregnancy in third trimester; and Low iron on their problem list.  Patient reports no complaints.  Contractions: Not present.  .  Movement: Present. Denies leaking of fluid.   The following portions of the patient's history were reviewed and updated as appropriate: allergies, current medications, past family history, past medical history, past social history, past surgical history and problem list. Problem list updated.  Objective:   Vitals:   09/08/17 1549  BP: 105/71  Pulse: (!) 103  Weight: 145 lb 6.4 oz (66 kg)    Fetal Status: Fetal Heart Rate (bpm): 136   Movement: Present     General:  Alert, oriented and cooperative. Patient is in no acute distress.  Skin: Skin is warm and dry. No rash noted.   Cardiovascular: Normal heart rate noted  Respiratory: Normal respiratory effort, no problems with respiration noted  Abdomen: Soft, gravid, appropriate for gestational age. Fundal height 35 cm Pain/Pressure: Absent     Pelvic:  Cervical exam deferred  GC, Chlam vaginal and GBS vaginal/rectal swabs done         Extremities: Normal range of motion.  Edema: None  Mental Status: Normal mood and affect. Normal behavior. Normal judgment and thought content.        Assessment and Plan:  Pregnancy: G1P0 at 7191w0d  Encounter for supervision of normal first pregnancy in third trimester - Plan: Cervicovaginal ancillary only, Strep Gp B NAA  HSV in third trimester - has been taking raltrex 1000 mg BID and as she now needs Valtrex to continue until birth, did refill of current prescription to give 60 tablets per month.   Client desires to continue Valtrex for suppression after birth but will need to be told to continue daily after pregnancy.  Advised to practice relaxation and slow deep breathing when having contractions - was very tense with vaginal swabs today.  Term labor symptoms and general obstetric precautions including but not limited to vaginal bleeding, contractions, leaking of fluid and fetal movement were reviewed in detail with the patient. Please refer to After Visit Summary for other counseling recommendations.  Return in one week or sooner if having any problems.  Nolene BernheimERRI Meital Riehl, RN, MSN, NP-BC Nurse Practitioner, [facility], [facility] 09/08/2017 3:56 PM

## 2017-09-10 LAB — STREP GP B NAA: Strep Gp B NAA: NEGATIVE

## 2017-09-10 LAB — CERVICOVAGINAL ANCILLARY ONLY
CHLAMYDIA, DNA PROBE: NEGATIVE
NEISSERIA GONORRHEA: NEGATIVE

## 2017-09-15 ENCOUNTER — Encounter: Payer: Self-pay | Admitting: Family Medicine

## 2017-09-15 ENCOUNTER — Encounter: Payer: Self-pay | Admitting: Radiology

## 2017-09-15 ENCOUNTER — Ambulatory Visit (INDEPENDENT_AMBULATORY_CARE_PROVIDER_SITE_OTHER): Payer: BLUE CROSS/BLUE SHIELD | Admitting: Family Medicine

## 2017-09-15 VITALS — BP 124/83 | HR 102 | Wt 146.0 lb

## 2017-09-15 DIAGNOSIS — A6009 Herpesviral infection of other urogenital tract: Secondary | ICD-10-CM

## 2017-09-15 DIAGNOSIS — O98313 Other infections with a predominantly sexual mode of transmission complicating pregnancy, third trimester: Secondary | ICD-10-CM

## 2017-09-15 DIAGNOSIS — O99013 Anemia complicating pregnancy, third trimester: Secondary | ICD-10-CM

## 2017-09-15 DIAGNOSIS — D649 Anemia, unspecified: Secondary | ICD-10-CM

## 2017-09-15 DIAGNOSIS — O99012 Anemia complicating pregnancy, second trimester: Secondary | ICD-10-CM

## 2017-09-15 DIAGNOSIS — Z3403 Encounter for supervision of normal first pregnancy, third trimester: Secondary | ICD-10-CM

## 2017-09-15 MED ORDER — ACYCLOVIR 200 MG PO CAPS: 400.0000 mg | ORAL_CAPSULE | Freq: Three times a day (TID) | ORAL | 0 refills | Status: AC

## 2017-09-15 NOTE — Progress Notes (Signed)
   PRENATAL VISIT NOTE  Subjective:  Deanna Perez is a 21 y.o. G1P0 at 6459w0d being seen today for ongoing prenatal care.  She is currently monitored for the following issues for this low-risk pregnancy and has Encounter for supervision of normal first pregnancy in third trimester; Vaginal bleeding in pregnancy, second trimester; Anemia in pregnancy, second trimester; Genital herpes affecting pregnancy in third trimester; and Low iron on their problem list.  Patient reports concern for HSV outbreak.  Contractions: Not present. Vag. Bleeding: None.  Movement: Present. Denies leaking of fluid.   The following portions of the patient's history were reviewed and updated as appropriate: allergies, current medications, past family history, past medical history, past social history, past surgical history and problem list. Problem list updated.  Objective:   Vitals:   09/15/17 1633  BP: 124/83  Pulse: (!) 102  Weight: 146 lb (66.2 kg)    Fetal Status: Fetal Heart Rate (bpm): 143 Fundal Height: 37 cm Movement: Present     General:  Alert, oriented and cooperative. Patient is in no acute distress.  Skin: Skin is warm and dry. No rash noted.   Cardiovascular: Normal heart rate noted  Respiratory: Normal respiratory effort, no problems with respiration noted  Abdomen: Soft, gravid, appropriate for gestational age.  Pain/Pressure: Absent     Pelvic: Cervical exam deferred      . + ulcerated lesion on left base of entroitus  Extremities: Normal range of motion.  Edema: None  Mental Status:  Normal mood and affect. Normal behavior. Normal judgment and thought content.   Assessment and Plan:  Pregnancy: G1P0 at 6559w0d  1. Anemia in pregnancy, second trimester taking Fe  2. Encounter for supervision of normal first pregnancy in third trimester UTD Discussed mode of delivery.   3. HSV Patient has recurrent outbreak-- started 1-2 days ago after shaving. Patient has been on treatment dose  since Sept (Valacyclovir 1000mg  BID).  - Changed to Acyclovir 400 TID - Recommended she stop shaving - Recommend vaginal examination every visit and at LD if she presents - Discussed that if she goes into labor in the next 2-3 weeks we will offer primary CS and if due date or after with no active lesions then she will eligible for vaginal birth.   Term labor symptoms and general obstetric precautions including but not limited to vaginal bleeding, contractions, leaking of fluid and fetal movement were reviewed in detail with the patient. Please refer to After Visit Summary for other counseling recommendations.  Return in about 1 week (around 09/22/2017) for Routine prenatal care.   Federico FlakeKimberly Niles Elania Crowl, MD

## 2017-09-27 ENCOUNTER — Ambulatory Visit (INDEPENDENT_AMBULATORY_CARE_PROVIDER_SITE_OTHER): Payer: BLUE CROSS/BLUE SHIELD | Admitting: Obstetrics and Gynecology

## 2017-09-27 VITALS — BP 124/80 | HR 109 | Wt 150.2 lb

## 2017-09-27 DIAGNOSIS — O98313 Other infections with a predominantly sexual mode of transmission complicating pregnancy, third trimester: Secondary | ICD-10-CM

## 2017-09-27 DIAGNOSIS — K831 Obstruction of bile duct: Secondary | ICD-10-CM

## 2017-09-27 DIAGNOSIS — A6009 Herpesviral infection of other urogenital tract: Secondary | ICD-10-CM

## 2017-09-27 DIAGNOSIS — O99713 Diseases of the skin and subcutaneous tissue complicating pregnancy, third trimester: Secondary | ICD-10-CM

## 2017-09-27 DIAGNOSIS — L282 Other prurigo: Secondary | ICD-10-CM

## 2017-09-27 DIAGNOSIS — O26613 Liver and biliary tract disorders in pregnancy, third trimester: Secondary | ICD-10-CM

## 2017-09-27 DIAGNOSIS — O26643 Intrahepatic cholestasis of pregnancy, third trimester: Secondary | ICD-10-CM

## 2017-09-27 HISTORY — DX: Obstruction of bile duct: K83.1

## 2017-09-27 HISTORY — DX: Intrahepatic cholestasis of pregnancy, third trimester: O26.643

## 2017-09-27 MED ORDER — VALACYCLOVIR HCL 1 G PO TABS: 1000.0000 mg | ORAL_TABLET | Freq: Two times a day (BID) | ORAL | 0 refills | Status: DC

## 2017-09-27 NOTE — Progress Notes (Signed)
Prenatal Visit Note Date: 09/27/2017 Clinic: [facility]  Subjective:  Deanna Perez is a 21 y.o. G1P0 at 6270w5d being seen today for ongoing prenatal care.  She is currently monitored for the following issues for this high-risk pregnancy and has Supervision of high risk pregnancy, antepartum, third trimester; Anemia in pregnancy, second trimester; Genital herpes affecting pregnancy in third trimester; Low iron; and Prurigo of pregnancy in third trimester on their problem list.  Patient reports noticed some belly itching a few weeks ago. ?worse at night and sometimes on soles and palms. better with lotion and never any rash. still feels prodromal s/s in GU area (discomfort).   Contractions: Not present.  .  Movement: Present. Denies leaking of fluid.   The following portions of the patient's history were reviewed and updated as appropriate: allergies, current medications, past family history, past medical history, past social history, past surgical history and problem list. Problem list updated.  Objective:   Vitals:   09/27/17 1421  BP: 124/80  Pulse: (!) 109  Weight: 150 lb 3.2 oz (68.1 kg)    Fetal Status: Fetal Heart Rate (bpm): 145 Fundal Height: 38 cm Movement: Present  Presentation: Vertex  General:  Alert, oriented and cooperative. Patient is in no acute distress.  Skin: Skin is warm and dry. No rash noted.   Cardiovascular: Normal heart rate noted  Respiratory: Normal respiratory effort, no problems with respiration noted  Abdomen: Soft, gravid, appropriate for gestational age. Pain/Pressure: Absent     Pelvic:  Cervical exam performed        EGBUS with lesion (crusted on top, nttp 1x1cm) at 4 o'clock on labia majora. No inguinal LAD, SSE neg, cx visually closed  Extremities: Normal range of motion.  Edema: None  Mental Status: Normal mood and affect. Normal behavior. Normal judgment and thought content.   Urinalysis:      Assessment and Plan:   Pregnancy: G1P0 at 7870w5d  1. Prurigo of pregnancy in third trimester [facility] precautions given.  - Comprehensive metabolic panel - Bile acids, total  2. Genital herpes affecting pregnancy in third trimester Recommend switching back to valtrex 1gm bid from acyclovir since no real improvement and theoretically safer than valtrex. Swab was + at initial outbreak; pt denies any prior similar s/s. D/w her that if in labor and/or ROM with s/s, recommend c-section. Also d/w her if +ICP that recommend delivery since >37wks.  Term labor symptoms and general obstetric precautions including but not limited to vaginal bleeding, contractions, leaking of fluid and fetal movement were reviewed in detail with the patient. Please refer to After Visit Summary for other counseling recommendations.  Return in about 1 week (around 10/04/2017) for rob.   [place] BingPickens, Aslee Such, MD

## 2017-09-28 ENCOUNTER — Inpatient Hospital Stay (HOSPITAL_COMMUNITY): Payer: BLUE CROSS/BLUE SHIELD | Admitting: Anesthesiology

## 2017-09-28 ENCOUNTER — Encounter (HOSPITAL_COMMUNITY): Payer: Self-pay | Admitting: Certified Registered Nurse Anesthetist

## 2017-09-28 ENCOUNTER — Other Ambulatory Visit: Payer: Self-pay | Admitting: Family Medicine

## 2017-09-28 ENCOUNTER — Inpatient Hospital Stay (HOSPITAL_COMMUNITY)
Admission: AD | Admit: 2017-09-28 | Discharge: 2017-10-01 | DRG: 786 | Disposition: A | Discharge status: Home / Self Care | Payer: BLUE CROSS/BLUE SHIELD | Source: Ambulatory Visit | Attending: Family Medicine | Admitting: Family Medicine

## 2017-09-28 ENCOUNTER — Telehealth: Payer: Self-pay | Admitting: Obstetrics and Gynecology

## 2017-09-28 ENCOUNTER — Encounter (HOSPITAL_COMMUNITY)
Admission: AD | Disposition: A | Discharge status: Home / Self Care | Payer: Self-pay | Source: Ambulatory Visit | Attending: Family Medicine

## 2017-09-28 DIAGNOSIS — K831 Obstruction of bile duct: Secondary | ICD-10-CM | POA: Diagnosis present

## 2017-09-28 DIAGNOSIS — Z3A38 38 weeks gestation of pregnancy: Secondary | ICD-10-CM | POA: Diagnosis not present

## 2017-09-28 DIAGNOSIS — D649 Anemia, unspecified: Secondary | ICD-10-CM | POA: Diagnosis present

## 2017-09-28 DIAGNOSIS — A6009 Herpesviral infection of other urogenital tract: Secondary | ICD-10-CM

## 2017-09-28 DIAGNOSIS — O9832 Other infections with a predominantly sexual mode of transmission complicating childbirth: Secondary | ICD-10-CM | POA: Diagnosis not present

## 2017-09-28 DIAGNOSIS — O9902 Anemia complicating childbirth: Secondary | ICD-10-CM | POA: Diagnosis present

## 2017-09-28 DIAGNOSIS — Z880 Allergy status to penicillin: Secondary | ICD-10-CM | POA: Diagnosis not present

## 2017-09-28 DIAGNOSIS — A6000 Herpesviral infection of urogenital system, unspecified: Secondary | ICD-10-CM | POA: Diagnosis present

## 2017-09-28 DIAGNOSIS — O9962 Diseases of the digestive system complicating childbirth: Secondary | ICD-10-CM

## 2017-09-28 DIAGNOSIS — Z98891 History of uterine scar from previous surgery: Secondary | ICD-10-CM

## 2017-09-28 DIAGNOSIS — O98313 Other infections with a predominantly sexual mode of transmission complicating pregnancy, third trimester: Secondary | ICD-10-CM

## 2017-09-28 DIAGNOSIS — O2662 Liver and biliary tract disorders in childbirth: Secondary | ICD-10-CM | POA: Diagnosis present

## 2017-09-28 DIAGNOSIS — K8020 Calculus of gallbladder without cholecystitis without obstruction: Secondary | ICD-10-CM | POA: Diagnosis not present

## 2017-09-28 LAB — TYPE AND SCREEN
ABO/RH(D): A POS
Antibody Screen: NEGATIVE

## 2017-09-28 LAB — COMPREHENSIVE METABOLIC PANEL
ALBUMIN: 3.7 g/dL (ref 3.5–5.5)
ALT: 12 IU/L (ref 0–32)
AST: 16 IU/L (ref 0–40)
Albumin/Globulin Ratio: 1.2 (ref 1.2–2.2)
Alkaline Phosphatase: 272 IU/L — ABNORMAL HIGH (ref 39–117)
BUN/Creatinine Ratio: 10 (ref 9–23)
BUN: 7 mg/dL (ref 6–20)
Bilirubin Total: <0.2 mg/dL (ref 0.0–1.2)
CALCIUM: 9.2 mg/dL (ref 8.7–10.2)
CO2: 16 mmol/L — AB (ref 20–29)
Chloride: 103 mmol/L (ref 96–106)
Creatinine, Ser: 0.67 mg/dL (ref 0.57–1.00)
GFR calc Af Amer: 145 mL/min/{1.73_m2} (ref >59)
GFR, EST NON AFRICAN AMERICAN: 126 mL/min/{1.73_m2} (ref >59)
GLOBULIN, TOTAL: 3.1 g/dL (ref 1.5–4.5)
Glucose: 114 mg/dL — ABNORMAL HIGH (ref 65–99)
POTASSIUM: 4 mmol/L (ref 3.5–5.2)
Sodium: 136 mmol/L (ref 134–144)
Total Protein: 6.8 g/dL (ref 6.0–8.5)

## 2017-09-28 LAB — CBC
HCT: 30.3 % — ABNORMAL LOW (ref 36.0–46.0)
Hemoglobin: 9.5 g/dL — ABNORMAL LOW (ref 12.0–15.0)
MCH: 26.8 pg (ref 26.0–34.0)
MCHC: 31.4 g/dL (ref 30.0–36.0)
MCV: 85.4 fL (ref 78.0–100.0)
PLATELETS: 241 10*3/uL (ref 150–400)
RBC: 3.55 MIL/uL — AB (ref 3.87–5.11)
RDW: 14.7 % (ref 11.5–15.5)
WBC: 9.5 10*3/uL (ref 4.0–10.5)

## 2017-09-28 LAB — BILE ACIDS, TOTAL: BILE ACIDS TOTAL: 10.2 umol/L (ref 4.7–24.5)

## 2017-09-28 SURGERY — Surgical Case
Anesthesia: Spinal

## 2017-09-28 MED ORDER — ONDANSETRON HCL 4 MG/2ML IJ SOLN
INTRAMUSCULAR | Status: AC
  Filled 2017-09-28: qty 2

## 2017-09-28 MED ORDER — LACTATED RINGERS IV SOLN
INTRAVENOUS | Status: DC | PRN
  Administered 2017-09-28: 40 [IU] via INTRAVENOUS

## 2017-09-28 MED ORDER — KETOROLAC TROMETHAMINE 30 MG/ML IJ SOLN: 30.0000 mg | Freq: Four times a day (QID) | INTRAMUSCULAR | Status: AC | PRN

## 2017-09-28 MED ORDER — LACTATED RINGERS IV SOLN
125.0000 mL/h | INTRAVENOUS | Status: DC
  Administered 2017-09-28 (×2): 125 mL/h via INTRAVENOUS

## 2017-09-28 MED ORDER — PHENYLEPHRINE 8 MG IN D5W 100 ML (0.08MG/ML) PREMIX OPTIME
INJECTION | INTRAVENOUS | Status: DC | PRN
  Administered 2017-09-28: 60 ug/min via INTRAVENOUS

## 2017-09-28 MED ORDER — FENTANYL CITRATE (PF) 100 MCG/2ML IJ SOLN
INTRAMUSCULAR | Status: AC
  Filled 2017-09-28: qty 2

## 2017-09-28 MED ORDER — MORPHINE SULFATE (PF) 0.5 MG/ML IJ SOLN
INTRAMUSCULAR | Status: DC | PRN
  Administered 2017-09-28: .2 mg via INTRATHECAL

## 2017-09-28 MED ORDER — PROMETHAZINE HCL 25 MG/ML IJ SOLN: 6.2500 mg | INTRAMUSCULAR | Status: DC | PRN

## 2017-09-28 MED ORDER — KETOROLAC TROMETHAMINE 30 MG/ML IJ SOLN
30.0000 mg | Freq: Four times a day (QID) | INTRAMUSCULAR | Status: AC | PRN
  Administered 2017-09-29: 30 mg via INTRAMUSCULAR

## 2017-09-28 MED ORDER — SODIUM CHLORIDE 0.9 % IJ SOLN
INTRAMUSCULAR | Status: AC
  Filled 2017-09-28: qty 20

## 2017-09-28 MED ORDER — LACTATED RINGERS IV SOLN
INTRAVENOUS | Status: DC | PRN
  Administered 2017-09-28: 23:00:00 via INTRAVENOUS

## 2017-09-28 MED ORDER — PHENYLEPHRINE HCL 10 MG/ML IJ SOLN
INTRAMUSCULAR | Status: DC | PRN
  Administered 2017-09-28: 80 ug via INTRAVENOUS

## 2017-09-28 MED ORDER — MORPHINE SULFATE (PF) 0.5 MG/ML IJ SOLN
INTRAMUSCULAR | Status: AC
  Filled 2017-09-28: qty 10

## 2017-09-28 MED ORDER — MEPERIDINE HCL 25 MG/ML IJ SOLN
INTRAMUSCULAR | Status: DC | PRN
  Administered 2017-09-28 (×2): 12.5 mg via INTRAVENOUS

## 2017-09-28 MED ORDER — OXYTOCIN 10 UNIT/ML IJ SOLN
INTRAMUSCULAR | Status: AC
  Filled 2017-09-28: qty 4

## 2017-09-28 MED ORDER — FENTANYL CITRATE (PF) 100 MCG/2ML IJ SOLN
INTRAMUSCULAR | Status: DC | PRN
  Administered 2017-09-28: 10 ug via INTRATHECAL

## 2017-09-28 MED ORDER — CEFAZOLIN SODIUM-DEXTROSE 2-4 GM/100ML-% IV SOLN
2.0000 g | INTRAVENOUS | Status: AC
  Administered 2017-09-28: 2 g via INTRAVENOUS

## 2017-09-28 MED ORDER — KETOROLAC TROMETHAMINE 30 MG/ML IJ SOLN
INTRAMUSCULAR | Status: AC
  Administered 2017-09-29: 30 mg via INTRAMUSCULAR
  Filled 2017-09-28: qty 1

## 2017-09-28 MED ORDER — ONDANSETRON HCL 4 MG/2ML IJ SOLN
INTRAMUSCULAR | Status: DC | PRN
  Administered 2017-09-28: 4 mg via INTRAVENOUS

## 2017-09-28 MED ORDER — LACTATED RINGERS IV SOLN
INTRAVENOUS | Status: DC | PRN
  Administered 2017-09-28 (×2): via INTRAVENOUS

## 2017-09-28 MED ORDER — DIPHENHYDRAMINE HCL 50 MG/ML IJ SOLN
INTRAMUSCULAR | Status: AC
  Filled 2017-09-28: qty 1

## 2017-09-28 MED ORDER — PHENYLEPHRINE 8 MG IN D5W 100 ML (0.08MG/ML) PREMIX OPTIME
INJECTION | INTRAVENOUS | Status: AC
  Filled 2017-09-28: qty 100

## 2017-09-28 MED ORDER — MEPERIDINE HCL 25 MG/ML IJ SOLN
INTRAMUSCULAR | Status: AC
  Filled 2017-09-28: qty 1

## 2017-09-28 MED ORDER — SOD CITRATE-CITRIC ACID 500-334 MG/5ML PO SOLN
30.0000 mL | Freq: Once | ORAL | Status: AC
  Administered 2017-09-28: 30 mL via ORAL
  Filled 2017-09-28: qty 15

## 2017-09-28 MED ORDER — CEFAZOLIN SODIUM-DEXTROSE 2-3 GM-%(50ML) IV SOLR
INTRAVENOUS | Status: AC
  Filled 2017-09-28: qty 50

## 2017-09-28 MED ORDER — FENTANYL CITRATE (PF) 100 MCG/2ML IJ SOLN
25.0000 ug | INTRAMUSCULAR | Status: DC | PRN
  Administered 2017-09-29: 25 ug via INTRAVENOUS
  Administered 2017-09-29: 50 ug via INTRAVENOUS

## 2017-09-28 MED ORDER — DIPHENHYDRAMINE HCL 50 MG/ML IJ SOLN
INTRAMUSCULAR | Status: DC | PRN
  Administered 2017-09-28: 25 mg via INTRAVENOUS

## 2017-09-28 MED ORDER — BUPIVACAINE IN DEXTROSE 0.75-8.25 % IT SOLN
INTRATHECAL | Status: DC | PRN
  Administered 2017-09-28: 1.5 mL via INTRATHECAL

## 2017-09-28 SURGICAL SUPPLY — 34 items
APL SKNCLS STERI-STRIP NONHPOA (GAUZE/BANDAGES/DRESSINGS) ×1
BENZOIN TINCTURE PRP APPL 2/3 (GAUZE/BANDAGES/DRESSINGS) ×2 IMPLANT
CHLORAPREP W/TINT 26ML (MISCELLANEOUS) ×2 IMPLANT
CLAMP CORD UMBIL (MISCELLANEOUS) IMPLANT
CLOSURE STERI STRIP 1/2 X4 (GAUZE/BANDAGES/DRESSINGS) ×1 IMPLANT
CLOTH BEACON ORANGE TIMEOUT ST (SAFETY) ×2 IMPLANT
DRSG OPSITE POSTOP 4X10 (GAUZE/BANDAGES/DRESSINGS) ×2 IMPLANT
ELECT REM PT RETURN 9FT ADLT (ELECTROSURGICAL) ×2
ELECTRODE REM PT RTRN 9FT ADLT (ELECTROSURGICAL) ×1 IMPLANT
EXTRACTOR VACUUM M CUP 4 TUBE (SUCTIONS) IMPLANT
GLOVE BIOGEL PI IND STRL 7.0 (GLOVE) ×2 IMPLANT
GLOVE BIOGEL PI IND STRL 7.5 (GLOVE) ×2 IMPLANT
GLOVE BIOGEL PI INDICATOR 7.0 (GLOVE) ×2
GLOVE BIOGEL PI INDICATOR 7.5 (GLOVE) ×2
GLOVE ECLIPSE 7.5 STRL STRAW (GLOVE) ×2 IMPLANT
GOWN STRL REUS W/TWL LRG LVL3 (GOWN DISPOSABLE) ×6 IMPLANT
KIT ABG SYR 3ML LUER SLIP (SYRINGE) IMPLANT
NDL HYPO 25X5/8 SAFETYGLIDE (NEEDLE) IMPLANT
NEEDLE HYPO 25X5/8 SAFETYGLIDE (NEEDLE) IMPLANT
NS IRRIG 1000ML POUR BTL (IV SOLUTION) ×2 IMPLANT
PACK C SECTION WH (CUSTOM PROCEDURE TRAY) ×2 IMPLANT
PAD ABD 7.5X8 STRL (GAUZE/BANDAGES/DRESSINGS) ×1 IMPLANT
PAD OB MATERNITY 4.3X12.25 (PERSONAL CARE ITEMS) ×2 IMPLANT
PENCIL SMOKE EVAC W/HOLSTER (ELECTROSURGICAL) ×2 IMPLANT
RTRCTR C-SECT PINK 25CM LRG (MISCELLANEOUS) ×2 IMPLANT
SPONGE GAUZE 4X4 12PLY STER LF (GAUZE/BANDAGES/DRESSINGS) ×2 IMPLANT
STRIP CLOSURE SKIN 1/2X4 (GAUZE/BANDAGES/DRESSINGS) ×2 IMPLANT
SUT VIC AB 0 CTX 36 (SUTURE) ×6
SUT VIC AB 0 CTX36XBRD ANBCTRL (SUTURE) ×3 IMPLANT
SUT VIC AB 2-0 CT1 27 (SUTURE) ×2
SUT VIC AB 2-0 CT1 TAPERPNT 27 (SUTURE) ×1 IMPLANT
SUT VIC AB 4-0 KS 27 (SUTURE) ×2 IMPLANT
TOWEL OR 17X24 6PK STRL BLUE (TOWEL DISPOSABLE) ×2 IMPLANT
TRAY FOLEY BAG SILVER LF 14FR (SET/KITS/TRAYS/PACK) ×2 IMPLANT

## 2017-09-28 NOTE — H&P (Signed)
Obstetric Preoperative History and Physical  Azyiah L Drew is a 21 y.o. G1P0000 with IUP at [redacted]w[redacted]d presenting for primary cesarean section. Pregnancy complicated by cholestasis of pregnancy and she has active genital herpes outbreak. Last meal was at 12 pm.   Prenatal Course Source of Care: [place]   Pregnancy complications or risks: Patient Active Problem List   Diagnosis Date Noted  . Cholestasis during pregnancy in third trimester 09/27/2017  . Low iron 08/11/2017  . Genital herpes affecting pregnancy in third trimester 07/28/2017  . Anemia in pregnancy, second trimester 06/19/2017  . Supervision of high risk pregnancy, antepartum, third trimester 03/10/2017   She plans to breastfeed She desires oral contraceptives (estrogen/progesterone) for postpartum contraception.   Clinic [facility] Prenatal Labs  Dating LMP Blood type: A/Positive/-- (05/10 1438) A pos  Genetic Screen 1 Screen: nl   AFP:   negative  Quad:     NIPS: Antibody:Negative (05/10 1438)Neg  Anatomic [place] Normal Rubella: 4.19 (05/10 1438)Immune  GTT Early:               Third trimester:  neg RPR: Non Reactive (05/10 1438) NR  Flu vaccine   08/2017 HBsAg: Negative (05/10 1438) Neg  TDaP vaccine      08/2017                                         Rhogam: n/a HIV:   NR  Baby Food     breastfeeding                                          GBS: neg (For PCN allergy, check sensitivities)  Contraception    undecided Pap:n/a  Circumcision      Wants to do if boy   Pediatrician   undecided   Support Person       Nature conservation officer Tool  Maternal Diabetes: No Genetic Screening: Normal Maternal Ultrasounds/Referrals: Normal Fetal Ultrasounds or other Referrals:  None Maternal Substance Abuse:  No Significant Maternal Medications:  None Significant Maternal Lab Results: None  Past Medical History:  Diagnosis Date  . Medical history non-contributory     Past Surgical History:  Procedure Laterality Date  . NO  PAST SURGERIES      OB History  Gravida Para Term Preterm AB Living  1 0 0 0 0 0  SAB TAB Ectopic Multiple Live Births  0 0 0 0 0    # Outcome Date GA Lbr Len/2nd Weight Sex Delivery Anes PTL Lv  1 Current               Social History   Socioeconomic History  . Marital status: Single    Spouse name: None  . Number of children: None  . Years of education: None  . Highest education level: None  Social Needs  . Financial resource strain: None  . Food insecurity - worry: None  . Food insecurity - inability: None  . Transportation needs - medical: None  . Transportation needs - non-medical: None  Occupational History  . None  Tobacco Use  . Smoking status: Never Smoker  . Smokeless tobacco: Never Used  Substance and Sexual Activity  . Alcohol use: No    Comment: occasional  . Drug use: No  .  Sexual activity: Yes    Birth control/protection: Pill  Other Topics Concern  . None  Social History Narrative  . None    Family History  Problem Relation Age of Onset  . Hypertension Mother   . Hypertension Father   . Heart disease Paternal Aunt   . Hypertension Paternal Grandmother   . Diabetes Paternal Grandmother   . Cancer Paternal Grandmother   . Hypertension Paternal Grandfather     Medications Prior to Admission  Medication Sig Dispense Refill Last Dose  . Doxylamine-Pyridoxine 10-10 MG TBEC Take 2 tabs at bedtime. If symptoms persist after 2 days, take 1 tab in the morning and 2 tabs at bedtime 100 tablet 6 Taking  . ferrous sulfate 325 (65 FE) MG tablet Take 1 tablet (325 mg total) by mouth daily with breakfast. 30 tablet 3 Taking  . Prenatal Vit-Fe Fumarate-FA (PRENATAL VITAMIN PO) Take 1 tablet by mouth daily.    Taking  . valACYclovir (VALTREX) 1000 MG tablet Take 1 tablet (1,000 mg total) by mouth 2 (two) times daily. 60 tablet 0     No Known Allergies  Review of Systems: Negative except for what is mentioned in HPI.  Physical Exam: Ht 5\' 1"  (1.549 m)    Wt 150 lb (68 kg)   LMP 12/30/2016 (Exact Date)   BMI 28.34 kg/m    CONSTITUTIONAL: Well-developed, well-nourished female in no acute distress.  HENT:  Normocephalic, atraumatic. Oropharynx is clear and moist EYES: Conjunctivae and EOM are normal. No scleral icterus.  NECK: Normal range of motion, supple SKIN: Skin is warm and dry. No rash noted. Not diaphoretic. No erythema. NEUROLGIC: Alert and oriented to person, place, and time. Normal reflexes, muscle tone coordination. No cranial nerve deficit noted. PSYCHIATRIC: Normal mood and affect. Normal behavior. CARDIOVASCULAR: Normal heart rate noted, regular rhythm RESPIRATORY: Effort and breath sounds normal, no problems with respiration noted ABDOMEN: Soft, nontender, nondistended, gravid.  PELVIC: Deferred MUSCULOSKELETAL: Normal range of motion. No edema and no tenderness. 2+ distal pulses.   Pertinent Labs/Studies:   Results for orders placed or performed in visit on 09/27/17 (from the past 72 hour(s))  Comprehensive metabolic panel     Status: Abnormal   Collection Time: 09/27/17  2:50 PM  Result Value Ref Range   Glucose 114 (H) 65 - 99 mg/dL    Comment: Specimen received in contact with cells. No visible hemolysis present. However GLUC may be decreased and K increased. Clinical correlation indicated.    BUN 7 6 - 20 mg/dL   Creatinine, Ser 1.610.67 0.57 - 1.00 mg/dL   GFR calc non Af Amer 126 >59 mL/min/1.73   GFR calc Af Amer 145 >59 mL/min/1.73   BUN/Creatinine Ratio 10 9 - 23   Sodium 136 134 - 144 mmol/L   Potassium 4.0 3.5 - 5.2 mmol/L    Comment: Specimen received in contact with cells. No visible hemolysis present. However GLUC may be decreased and K increased. Clinical correlation indicated.    Chloride 103 96 - 106 mmol/L   CO2 16 (L) 20 - 29 mmol/L   Calcium 9.2 8.7 - 10.2 mg/dL   Total Protein 6.8 6.0 - 8.5 g/dL   Albumin 3.7 3.5 - 5.5 g/dL   Globulin, Total 3.1 1.5 - 4.5 g/dL   Albumin/Globulin Ratio 1.2  1.2 - 2.2   Bilirubin Total <0.2 0.0 - 1.2 mg/dL   Alkaline Phosphatase 272 (H) 39 - 117 IU/L   AST 16 0 - 40 IU/L  ALT 12 0 - 32 IU/L  Bile acids, total     Status: None   Collection Time: 09/27/17  2:50 PM  Result Value Ref Range   Bile Acids Total 10.2 4.7 - 24.5 umol/L    Comment:                      Pregnant female reference interval                       (15 - 45 years):      3.7 - 14.5     Assessment and Plan :Shon Milletyndaja L Amspacher is a 21 y.o. G1P0000 at 7145w6d being admitted for primary cesarean section secondary to active genital herpes outbreak and cholestasis of pregnancy. The risks of cesarean section discussed with the patient included but were not limited to: bleeding which may require transfusion or reoperation; infection which may require antibiotics; injury to bowel, bladder, ureters or other surrounding organs; injury to the fetus; need for additional procedures including hysterectomy in the event of a life-threatening hemorrhage; placental abnormalities wth subsequent pregnancies, incisional problems, thromboembolic phenomenon and other postoperative/anesthesia complications. The patient concurred with the proposed plan, giving informed written consent for the procedure. Patient has been NPO since last night she will remain NPO for procedure. Anesthesia and OR aware. Preoperative prophylactic antibiotics and SCDs ordered on call to the OR.   Rolm BookbinderAmber Eren Puebla, DO OB Fellow Faculty Practice, [facility]

## 2017-09-28 NOTE — Telephone Encounter (Signed)
OB Telephone Note  Patient called and d/w her that her BA are elevated (above 10 micromol/L) so she does have cholestasis and given that she is >37wks, recommend delivery. She still has HSV genital prodromal s/s so recommend c-section which she amenable to; pt told to come to L&D later this morning. Patient has been NPO and told her to stay that way. Dr. Adrian BlackwaterStinson notified.   Cornelia Copaharlie Dannel Rafter, Jr MD Attending [facility] 09/28/2017 Time: [phone]

## 2017-09-28 NOTE — Anesthesia Preprocedure Evaluation (Addendum)
Anesthesia Evaluation  Patient identified by MRN, date of birth, ID band Patient awake    Reviewed: Allergy & Precautions, NPO status , Patient's Chart, lab work & pertinent test results  Airway Mallampati: II  TM Distance: >3 FB Neck ROM: Full    Dental  (+) Dental Advisory Given   Pulmonary neg pulmonary ROS,    Pulmonary exam normal breath sounds clear to auscultation       Cardiovascular negative cardio ROS Normal cardiovascular exam Rhythm:Regular Rate:Normal     Neuro/Psych negative neurological ROS  negative psych ROS   GI/Hepatic negative GI ROS, Neg liver ROS,   Endo/Other  negative endocrine ROS  Renal/GU negative Renal ROS  negative genitourinary   Musculoskeletal negative musculoskeletal ROS (+)   Abdominal   Peds  Hematology  (+) anemia ,   Anesthesia Other Findings HSV  Reproductive/Obstetrics (+) Pregnancy                            Lab Results  Component Value Date   WBC 9.5 09/28/2017   HGB 9.5 (L) 09/28/2017   HCT 30.3 (L) 09/28/2017   MCV 85.4 09/28/2017   PLT 241 09/28/2017     Anesthesia Physical Anesthesia Plan  ASA: II  Anesthesia Plan: Spinal   Post-op Pain Management:    Induction:   PONV Risk Score and Plan: Treatment may vary due to age or medical condition  Airway Management Planned: Natural Airway and Nasal Cannula  Additional Equipment: None  Intra-op Plan:   Post-operative Plan:   Informed Consent: I have reviewed the patients History and Physical, chart, labs and discussed the procedure including the risks, benefits and alternatives for the proposed anesthesia with the patient or authorized representative who has indicated his/her understanding and acceptance.   Dental advisory given  Plan Discussed with: CRNA  Anesthesia Plan Comments:         Anesthesia Quick Evaluation

## 2017-09-28 NOTE — Transfer of Care (Signed)
Immediate Anesthesia Transfer of Care Note  Patient: Deanna MilletAyndaja L Perez  Procedure(s) Performed: CESAREAN SECTION (N/A )  Patient Location: PACU  Anesthesia Type:Spinal  Level of Consciousness: awake, alert  and oriented  Airway & Oxygen Therapy: Patient Spontanous Breathing  Post-op Assessment: Report given to RN and Post -op Vital signs reviewed and stable  Post vital signs: Reviewed and stable  Last Vitals:  Vitals:   09/28/17 1709 09/28/17 1823  BP: 120/73 119/74  Pulse: 85 86  Resp: 20 18  Temp: 37 C     Last Pain:  Vitals:   09/28/17 1823  TempSrc:   PainSc: 0-No pain         Complications: No apparent anesthesia complications

## 2017-09-28 NOTE — Anesthesia Procedure Notes (Signed)
Spinal  Patient location during procedure: OR Start time: 09/28/2017 10:10 PM End time: 09/28/2017 10:13 PM Staffing Anesthesiologist: Effie Berkshire, MD Performed: anesthesiologist  Preanesthetic Checklist Completed: patient identified, site marked, surgical consent, pre-op evaluation, timeout performed, IV checked, risks and benefits discussed and monitors and equipment checked Spinal Block Patient position: sitting Prep: Betadine Patient monitoring: heart rate, continuous pulse ox, blood pressure and cardiac monitor Approach: midline Location: L4-5 Injection technique: single-shot Needle Needle type: Introducer and Pencan  Needle gauge: 24 G Needle length: 9 cm Additional Notes Negative paresthesia. Negative blood return. Positive free-flowing CSF. Expiration date of kit checked and confirmed. Patient tolerated procedure well, without complications.

## 2017-09-28 NOTE — Op Note (Signed)
Deanna Perez PROCEDURE DATE: 09/28/2017  PREOPERATIVE DIAGNOSIS: Intrauterine pregnancy at  504w6d weeks gestation; herpes virus infection and cholestasis  POSTOPERATIVE DIAGNOSIS: The same  PROCEDURE: Primary Low Transverse Cesarean Section  SURGEON:  Dr. Candelaria CelesteJacob Stinson  ASSISTANT:   INDICATIONS: Deanna Milletyndaja L Spearing is a 21 y.o. G1P0000 at 234w6d scheduled for cesarean section secondary to herpes virus infection and cholestasis.  The risks of cesarean section discussed with the patient included but were not limited to: bleeding which may require transfusion or reoperation; infection which may require antibiotics; injury to bowel, bladder, ureters or other surrounding organs; injury to the fetus; need for additional procedures including hysterectomy in the event of a life-threatening hemorrhage; placental abnormalities wth subsequent pregnancies, incisional problems, thromboembolic phenomenon and other postoperative/anesthesia complications. The patient concurred with the proposed plan, giving informed written consent for the procedure.    FINDINGS:  Viable female infant in vertex presentation.  Apgars 9 and 10, weight, 5 pounds and 15 ounces.  Clear amniotic fluid.  Intact placenta, three vessel cord.  Normal uterus, fallopian tubes and ovaries bilaterally.  ANESTHESIA:    Spinal INTRAVENOUS FLUIDS:2200 ml ESTIMATED BLOOD LOSS: 800 ml URINE OUTPUT:  150 ml SPECIMENS: Placenta sent to L&D COMPLICATIONS: None immediate  PROCEDURE IN DETAIL:  The patient received intravenous antibiotics and had sequential compression devices applied to her lower extremities while in the preoperative area.  She was then taken to the operating room where spinal anesthesia was administered and was found to be adequate. She was then placed in a dorsal supine position with a leftward tilt, and prepped and draped in a sterile manner.  A foley catheter was placed into her bladder and attached to constant gravity,  which drained clear fluid throughout.  After an adequate timeout was performed, a Pfannenstiel skin incision was made with scalpel and carried through to the underlying layer of fascia. The fascia was incised in the midline and this incision was extended bilaterally using the Mayo scissors. Kocher clamps were applied to the superior aspect of the fascial incision and the underlying rectus muscles were dissected off bluntly. A similar process was carried out on the inferior aspect of the facial incision. The rectus muscles were separated in the midline bluntly and the peritoneum was entered bluntly. An Alexis retractor was placed to aid in visualization of the uterus.  Attention was turned to the lower uterine segment where a transverse hysterotomy was made with a scalpel and extended bilaterally bluntly. The infant was successfully delivered, and cord was clamped and cut and infant was handed over to awaiting neonatology team. Uterine massage was then administered and the placenta delivered intact with three-vessel cord. The uterus was then cleared of clot and debris.  The hysterotomy was closed with 0 Vicryl in a running locked fashion, and an imbricating layer was also placed with a 0 Vicryl. Overall, excellent hemostasis was noted. The abdomen and the pelvis were cleared of all clot and debris and the Jon Gillslexis was removed. Hemostasis was confirmed on all surfaces.  The peritoneum was reapproximated using 2-0 vicryl running stitches. The fascia was then closed using 0 Vicryl in a running fashion. The skin was closed with 4-0 vicryl. The patient tolerated the procedure well. Sponge, lap, instrument and needle counts were correct x 2. She was taken to the recovery room in stable condition.    Levie HeritageStinson, Jacob J, DO 09/28/2017 11:09 PM

## 2017-09-29 ENCOUNTER — Encounter (HOSPITAL_COMMUNITY): Payer: Self-pay

## 2017-09-29 DIAGNOSIS — B009 Herpesviral infection, unspecified: Secondary | ICD-10-CM

## 2017-09-29 DIAGNOSIS — Z98891 History of uterine scar from previous surgery: Secondary | ICD-10-CM

## 2017-09-29 LAB — CBC
HEMATOCRIT: 25.2 % — AB (ref 36.0–46.0)
Hemoglobin: 8 g/dL — ABNORMAL LOW (ref 12.0–15.0)
MCH: 27.1 pg (ref 26.0–34.0)
MCHC: 31.7 g/dL (ref 30.0–36.0)
MCV: 85.4 fL (ref 78.0–100.0)
PLATELETS: 194 10*3/uL (ref 150–400)
RBC: 2.95 MIL/uL — AB (ref 3.87–5.11)
RDW: 14.7 % (ref 11.5–15.5)
WBC: 10.8 10*3/uL — AB (ref 4.0–10.5)

## 2017-09-29 LAB — RPR: RPR Ser Ql: NONREACTIVE

## 2017-09-29 MED ORDER — ACETAMINOPHEN 325 MG PO TABS
650.0000 mg | ORAL_TABLET | ORAL | Status: DC | PRN
  Administered 2017-09-30: 650 mg via ORAL
  Filled 2017-09-29: qty 2

## 2017-09-29 MED ORDER — VALACYCLOVIR HCL 500 MG PO TABS
1000.0000 mg | ORAL_TABLET | Freq: Two times a day (BID) | ORAL | Status: DC
  Administered 2017-09-29 – 2017-10-01 (×5): 1000 mg via ORAL
  Filled 2017-09-29 (×6): qty 2

## 2017-09-29 MED ORDER — DEXTROSE 5 % IV SOLN
1.0000 ug/kg/h | INTRAVENOUS | Status: DC | PRN
  Filled 2017-09-29: qty 5

## 2017-09-29 MED ORDER — DIPHENHYDRAMINE HCL 25 MG PO CAPS: 25.0000 mg | ORAL_CAPSULE | Freq: Four times a day (QID) | ORAL | Status: DC | PRN

## 2017-09-29 MED ORDER — ZOLPIDEM TARTRATE 5 MG PO TABS: 5.0000 mg | ORAL_TABLET | Freq: Every evening | ORAL | Status: DC | PRN

## 2017-09-29 MED ORDER — SIMETHICONE 80 MG PO CHEW
80.0000 mg | CHEWABLE_TABLET | Freq: Three times a day (TID) | ORAL | Status: DC
  Administered 2017-09-29 – 2017-10-01 (×6): 80 mg via ORAL
  Filled 2017-09-29 (×6): qty 1

## 2017-09-29 MED ORDER — PRENATAL MULTIVITAMIN CH
1.0000 | ORAL_TABLET | Freq: Every day | ORAL | Status: DC
  Administered 2017-09-29 – 2017-10-01 (×3): 1 via ORAL
  Filled 2017-09-29 (×3): qty 1

## 2017-09-29 MED ORDER — NALBUPHINE HCL 10 MG/ML IJ SOLN: 5.0000 mg | INTRAMUSCULAR | Status: DC | PRN

## 2017-09-29 MED ORDER — SIMETHICONE 80 MG PO CHEW: 80.0000 mg | CHEWABLE_TABLET | ORAL | Status: DC | PRN

## 2017-09-29 MED ORDER — OXYCODONE-ACETAMINOPHEN 5-325 MG PO TABS
2.0000 | ORAL_TABLET | ORAL | Status: DC | PRN
  Administered 2017-09-29 – 2017-09-30 (×2): 2 via ORAL
  Filled 2017-09-29 (×2): qty 2

## 2017-09-29 MED ORDER — COCONUT OIL OIL
1.0000 "application " | TOPICAL_OIL | Status: DC | PRN
  Administered 2017-10-01: 1 via TOPICAL
  Filled 2017-09-29: qty 120

## 2017-09-29 MED ORDER — IBUPROFEN 600 MG PO TABS
600.0000 mg | ORAL_TABLET | Freq: Four times a day (QID) | ORAL | Status: DC
  Administered 2017-09-29 – 2017-10-01 (×9): 600 mg via ORAL
  Filled 2017-09-29 (×11): qty 1

## 2017-09-29 MED ORDER — OXYCODONE-ACETAMINOPHEN 5-325 MG PO TABS: 1.0000 | ORAL_TABLET | ORAL | Status: DC | PRN

## 2017-09-29 MED ORDER — DIPHENHYDRAMINE HCL 25 MG PO CAPS
25.0000 mg | ORAL_CAPSULE | ORAL | Status: DC | PRN
  Administered 2017-09-29: 25 mg via ORAL
  Filled 2017-09-29: qty 1

## 2017-09-29 MED ORDER — SCOPOLAMINE 1 MG/3DAYS TD PT72
1.0000 | MEDICATED_PATCH | Freq: Once | TRANSDERMAL | Status: DC
  Filled 2017-09-29: qty 1

## 2017-09-29 MED ORDER — ENOXAPARIN SODIUM 40 MG/0.4ML ~~LOC~~ SOLN
40.0000 mg | SUBCUTANEOUS | Status: DC
  Administered 2017-09-29 – 2017-10-01 (×3): 40 mg via SUBCUTANEOUS
  Filled 2017-09-29 (×4): qty 0.4

## 2017-09-29 MED ORDER — LACTATED RINGERS IV SOLN
INTRAVENOUS | Status: DC
  Administered 2017-09-29: 11:00:00 via INTRAVENOUS
  Administered 2017-09-29: 125 mL/h via INTRAVENOUS

## 2017-09-29 MED ORDER — MENTHOL 3 MG MT LOZG: 1.0000 | LOZENGE | OROMUCOSAL | Status: DC | PRN

## 2017-09-29 MED ORDER — FENTANYL CITRATE (PF) 100 MCG/2ML IJ SOLN
INTRAMUSCULAR | Status: AC
  Filled 2017-09-29: qty 2

## 2017-09-29 MED ORDER — FERROUS SULFATE 325 (65 FE) MG PO TABS: 325.0000 mg | ORAL_TABLET | Freq: Every day | ORAL | Status: DC

## 2017-09-29 MED ORDER — FERROUS SULFATE 325 (65 FE) MG PO TABS
325.0000 mg | ORAL_TABLET | Freq: Two times a day (BID) | ORAL | Status: DC
  Administered 2017-09-29 – 2017-10-01 (×5): 325 mg via ORAL
  Filled 2017-09-29 (×5): qty 1

## 2017-09-29 MED ORDER — WITCH HAZEL-GLYCERIN EX PADS: 1.0000 "application " | MEDICATED_PAD | CUTANEOUS | Status: DC | PRN

## 2017-09-29 MED ORDER — NALBUPHINE HCL 10 MG/ML IJ SOLN: 5.0000 mg | Freq: Once | INTRAMUSCULAR | Status: DC | PRN

## 2017-09-29 MED ORDER — SIMETHICONE 80 MG PO CHEW
80.0000 mg | CHEWABLE_TABLET | ORAL | Status: DC
  Administered 2017-09-30 – 2017-10-01 (×2): 80 mg via ORAL
  Filled 2017-09-29 (×2): qty 1

## 2017-09-29 MED ORDER — DIBUCAINE 1 % RE OINT: 1.0000 "application " | TOPICAL_OINTMENT | RECTAL | Status: DC | PRN

## 2017-09-29 MED ORDER — SODIUM CHLORIDE 0.9% FLUSH: 3.0000 mL | INTRAVENOUS | Status: DC | PRN

## 2017-09-29 MED ORDER — OXYTOCIN 40 UNITS IN LACTATED RINGERS INFUSION - SIMPLE MED: 2.5000 [IU]/h | INTRAVENOUS | Status: AC

## 2017-09-29 MED ORDER — TETANUS-DIPHTH-ACELL PERTUSSIS 5-2.5-18.5 LF-MCG/0.5 IM SUSP: 0.5000 mL | Freq: Once | INTRAMUSCULAR | Status: DC

## 2017-09-29 MED ORDER — DIPHENHYDRAMINE HCL 50 MG/ML IJ SOLN: 12.5000 mg | INTRAMUSCULAR | Status: DC | PRN

## 2017-09-29 MED ORDER — LACTATED RINGERS IV BOLUS (SEPSIS)
500.0000 mL | Freq: Once | INTRAVENOUS | Status: AC
  Administered 2017-09-29: 500 mL via INTRAVENOUS

## 2017-09-29 MED ORDER — ACETAMINOPHEN 500 MG PO TABS
1000.0000 mg | ORAL_TABLET | Freq: Four times a day (QID) | ORAL | Status: AC
  Administered 2017-09-29 (×2): 1000 mg via ORAL
  Filled 2017-09-29 (×3): qty 2

## 2017-09-29 MED ORDER — SENNOSIDES-DOCUSATE SODIUM 8.6-50 MG PO TABS
2.0000 | ORAL_TABLET | ORAL | Status: DC
  Administered 2017-09-30 – 2017-10-01 (×2): 2 via ORAL
  Filled 2017-09-29 (×2): qty 2

## 2017-09-29 MED ORDER — NALOXONE HCL 0.4 MG/ML IJ SOLN: 0.4000 mg | INTRAMUSCULAR | Status: DC | PRN

## 2017-09-29 MED ORDER — NALBUPHINE HCL 10 MG/ML IJ SOLN
5.0000 mg | Freq: Once | INTRAMUSCULAR | Status: DC | PRN
Start: 2017-09-29 — End: 2017-10-01

## 2017-09-29 MED ORDER — ONDANSETRON HCL 4 MG/2ML IJ SOLN: 4.0000 mg | Freq: Three times a day (TID) | INTRAMUSCULAR | Status: DC | PRN

## 2017-09-29 NOTE — Lactation Note (Signed)
This note was copied from a baby's chart. Lactation Consultation Note  Patient Name: Deanna Perez [place] GNFAO'ZToday's Date: 09/29/2017 Reason for consult: Initial assessment;Early term 37-38.6wks;Infant < 6lbs;1st time breastfeeding;Primapara   Initial assessment with mom of 15 hour old infant. Infant with 4 BF for 30-45 minutes, 4 BF attempts, 2 voids and 2 stools since birth. LATCH scores 4-9.infant weight 5 lb 13.5 oz with 2% weight loss since birth. Mom reports infant has been feeding well today.   Caring for your LPT infant guidelines given to mom and reviewed since infant is less than 6 pounds. Enc mom to fed infant STS 8-12 x in 24 hours at first feeding cues with no longer than 3 hours between feeds. Reviewed decreasing stimulation to infant between feeds. Reviewed colostrum, milk coming to volume, infant awakening techniques, infant feeding volumes and spoon feeding. Enc mom to  Hand express post BF and offer infant any EBM in a spoon.   Was planning to reviewed hand expression with mom and room filled with visitors at this time. Reported to RN that I would recommend mom hand express and spoon feed post BF.   Discussed with mom that if infant tires out and is unwilling to feed at the breast that I would recommend she begin pumping with DEBP.   BF Resources Handout and LC Brochure given, mom informed of IP/OP Services, BF Support Groups and LC phone #. Enc mom to call out for feeding assistance as needed. [facility] and has spoken with WIC today. Mom does not have a pump at home, [facility] in the event it is needed at d/c.   Mom reports she has no questions/concerns at this time.    Maternal Data Formula Feeding for Exclusion: No Has patient been taught Hand Expression?: (mom was unsure, Room full of visitors currently) Does the patient have breastfeeding experience prior to this delivery?: No  Feeding Feeding Type: Breast Fed Length of feed: 45 min  LATCH Score                   Interventions    Lactation Tools Discussed/Used WIC Program: Yes   Consult Status Consult Status: Follow-up Date: 09/30/17 Follow-up type: In-patient    Silas FloodSharon S Byron Peacock 09/29/2017, 2:44 PM

## 2017-09-29 NOTE — Anesthesia Postprocedure Evaluation (Signed)
Anesthesia Post Note  Patient: Deanna MilletAyndaja L Perez  Procedure(s) Performed: CESAREAN SECTION (N/A )     Patient location during evaluation: Mother Baby Anesthesia Type: Spinal Level of consciousness: awake Pain management: pain level controlled Vital Signs Assessment: post-procedure vital signs reviewed and stable Respiratory status: spontaneous breathing Cardiovascular status: stable Postop Assessment: no headache, no backache, spinal receding, patient able to bend at knees, no apparent nausea or vomiting and adequate PO intake Anesthetic complications: no    Last Vitals:  Vitals:   09/29/17 0630 09/29/17 0741  BP:  120/80  Pulse:  77  Resp:  20  Temp:  36.9 C  SpO2: 95% 98%    Last Pain:  Vitals:   09/29/17 0741  TempSrc:   PainSc: 0-No pain   Pain Goal: Patients Stated Pain Goal: 0 (09/29/17 0150)               [name],[name]

## 2017-09-29 NOTE — Addendum Note (Signed)
Addendum  created 09/29/17 [id number] by Renford DillsMullins, Jeffie Widdowson L, CRNA   Sign clinical note

## 2017-09-29 NOTE — Progress Notes (Signed)
POSTPARTUM PROGRESS NOTE  Post Partum Day 1 Subjective:  Deanna Perez is a 21 y.o. G1P1001 2828w6d s/p LTCS for HSV.  No acute events overnight.  Pt denies problems with ambulating, voiding or po intake.  She denies nausea or vomiting.  Pain is well controlled.  She has had flatus. She has not had bowel movement.  Lochia Minimal.   Objective: Blood pressure 111/75, pulse (!) 105, temperature 98.7 F (37.1 C), temperature source Oral, resp. rate 18, height 5\' 1"  (1.549 m), weight 150 lb (68 kg), last menstrual period 12/30/2016, SpO2 95 %, unknown if currently breastfeeding.  Physical Exam:  General: alert, cooperative and no distress Lochia:normal flow Chest: no respiratory distress Heart:regular rate, distal pulses intact Abdomen: soft, nontender,  Uterine Fundus: firm, appropriately tender DVT Evaluation: No calf swelling or tenderness Extremities: no edema  Recent Labs    09/28/17 1230 09/29/17 0539  HGB 9.5* 8.0*  HCT 30.3* 25.2*    Assessment/Plan:  ASSESSMENT: Deanna Perez is a 21 y.o. G1P1001 1528w6d s/p LTCS for HSV  IV iron, am CBC  Plan for discharge tomorrow   LOS: 1 day   Scott BlandDO 09/29/2017, 7:49 AM    OB FELLOW POSTPARTUM PROGRESS NOTE ATTESTATION I have seen and examined this patient and agree with above documentation in the resident's note.  POD#1. Hgb 8.0 from 9.5. On iron. VS stable OOB, continue monitor VS. Repeat CBC in am  Frederik PearJulie P Aronda Burford, MD OB Fellow 09/29/2017

## 2017-09-29 NOTE — Anesthesia Postprocedure Evaluation (Signed)
Anesthesia Post Note  Patient: Deanna MilletAyndaja L Perez  Procedure(s) Performed: CESAREAN SECTION (N/A )     Patient location during evaluation: PACU Anesthesia Type: Spinal Level of consciousness: oriented and awake and alert Pain management: pain level controlled Vital Signs Assessment: post-procedure vital signs reviewed and stable Respiratory status: spontaneous breathing, respiratory function stable and patient connected to nasal cannula oxygen Cardiovascular status: blood pressure returned to baseline and stable Postop Assessment: no headache, no backache, no apparent nausea or vomiting, spinal receding and patient able to bend at knees Anesthetic complications: no    Last Vitals:  Vitals:   09/29/17 0445 09/29/17 0630  BP:    Pulse:    Resp:    Temp:    SpO2: 97% 95%    Last Pain:  Vitals:   09/29/17 0630  TempSrc:   PainSc: 0-No pain   Pain Goal: Patients Stated Pain Goal: 0 (09/29/17 0150)               Shelton SilvasKevin D Hollis

## 2017-09-30 LAB — CBC
HCT: 27.1 % — ABNORMAL LOW (ref 36.0–46.0)
HEMOGLOBIN: 8.4 g/dL — AB (ref 12.0–15.0)
MCH: 26.8 pg (ref 26.0–34.0)
MCHC: 31 g/dL (ref 30.0–36.0)
MCV: 86.6 fL (ref 78.0–100.0)
PLATELETS: 217 10*3/uL (ref 150–400)
RBC: 3.13 MIL/uL — AB (ref 3.87–5.11)
RDW: 15 % (ref 11.5–15.5)
WBC: 11.1 10*3/uL — AB (ref 4.0–10.5)

## 2017-09-30 NOTE — Lactation Note (Addendum)
This note was copied from a baby's chart. Lactation Consultation Note  Patient Name: Girl Prescott Parmayndaja Jarmon EAVWU'JToday's Date: 09/30/2017 Reason for consult: Follow-up assessment;Early term 37-38.6wks;Infant < 6lbs;Infant weight loss  Baby 45 hours old. Mom reports that baby has been increasingly sleepy at the breast and has had a lot of short feeds. Johnny BridgeMartha, RN reports that she has assisted with latching, but noted baby's weight loss and short feeds as well. Discussed infant behavior with mom, and set mom up with DEBP Leotis Shames(Lauren, RN assisted with pump set-up) and mom able to pump 17 ml of transitional breast milk. Assisted mom to syringe and finger-feed 7 ml--since this was the first supplementation--and baby tolerated well. Enc mom to give remaining 10 ml as soon as baby cueing to nurse again. Plan is for mom to then put baby to breast with cues and at least every 3 hours, then supplement according to supplementation guidelines--which were given with review. Enc mom to post-pump followed by hand expression. Discussed assessment and interventions with bedside RN.  Maternal Data    Feeding Feeding Type: Breast Milk Length of feed: 10 min  LATCH Score                   Interventions    Lactation Tools Discussed/Used Tools: Pump Breast pump type: Double-Electric Breast Pump Pump Review: Setup, frequency, and cleaning;Milk Storage Initiated by:: JW Date initiated:: 09/30/17   Consult Status Consult Status: Follow-up Date: 10/01/17 Follow-up type: In-patient    Sherlyn HayJennifer D Kippy Melena 09/30/2017, 7:50 PM

## 2017-09-30 NOTE — Progress Notes (Signed)
POSTPARTUM PROGRESS NOTE  POD#2  Subjective:  Shon Milletyndaja L Lauman is a 21 y.o. G1P1001 s/p pLTCS at 1855w6d.  No acute events overnight.  Pt denies problems with ambulating, voiding or po intake.  She denies nausea or vomiting.  Pain is well controlled.  She has had flatus.  Lochia Small. Ambulating well w/o lightheadedness or dizziness.   Objective: Blood pressure 112/66, pulse 76, temperature 98 F (36.7 C), temperature source Oral, resp. rate 18, height 5\' 1"  (1.549 m), weight 150 lb (68 kg), last menstrual period 12/30/2016, SpO2 98 %, unknown if currently breastfeeding.  Physical Exam:  General: alert, cooperative and no distress Chest: no respiratory distress Heart:regular rate, distal pulses intact Abdomen: soft, nontender,  Uterine Fundus: firm, appropriately tender DVT Evaluation: No calf swelling or tenderness Extremities: no edema Skin: warm, dry;Honeycomb dressing in place over C/S incision with small amount of saturation.  Recent Labs    09/29/17 0539 09/30/17 0507  HGB 8.0* 8.4*  HCT 25.2* 27.1*    Assessment/Plan: Shon Milletyndaja L Boehler is a 21 y.o. G1P1001 s/p pLTCS at 1955w6d   POD#1 - Doing well Anemia - Hgb 9.5 pre-op -> 8.0 ->8.4. She is asymptomatic. Ferrous sulfate BID Contraception: counseled today on contraception and emphasized LARCs Feeding: breast Dispo: Plan for discharge tomorrow.   LOS: 2 days   Kandra NicolasJulie P DegeleMD 09/30/2017, 9:26 AM

## 2017-10-01 MED ORDER — OXYCODONE-ACETAMINOPHEN 5-325 MG PO TABS: 1.0000 | ORAL_TABLET | ORAL | 0 refills | Status: DC | PRN

## 2017-10-01 MED ORDER — FERROUS SULFATE 325 (65 FE) MG PO TABS: 325.0000 mg | ORAL_TABLET | Freq: Two times a day (BID) | ORAL | 3 refills | Status: DC

## 2017-10-01 NOTE — Discharge Instructions (Signed)

## 2017-10-01 NOTE — Discharge Summary (Signed)
OB Discharge Summary     Patient Name: Deanna Perez DOB: [birth year] MRN: [id number]  Date of admission: 09/28/2017 Delivering MD: Levie HeritageSTINSON, JACOB J   Date of discharge: 10/01/2017  Admitting diagnosis: cholestasis and HSV Intrauterine pregnancy: 826w6d     Secondary diagnosis:  Active Problems:   Status post cesarean delivery  Additional problems: None      Discharge diagnosis: Term Pregnancy Delivered                                                                                                Post partum procedures:N/A  Augmentation: None  Complications: None  Hospital course:  Sceduled C/S   21 y.o. yo G1P1001 at 4626w6d was admitted to the hospital 09/28/2017 for scheduled cesarean section with the following indication:Active HSV.  Membrane Rupture Time/Date: 10:35 PM ,09/28/2017   Patient delivered a Viable infant.09/28/2017  Details of operation can be found in separate operative note.  Pateint had an uncomplicated postpartum course.  She is ambulating, tolerating a regular diet, passing flatus, and urinating well. Patient is discharged home in stable condition on  10/01/17         Physical exam  Vitals:   09/29/17 2130 09/30/17 0525 09/30/17 1852 10/01/17 0641  BP: 131/86 112/66 123/80 117/71  Pulse: 82 76 80 83  Resp: 18 18 18 18   Temp: 98 F (36.7 C) 98 F (36.7 C) 98.6 F (37 C) 98.1 F (36.7 C)  TempSrc: Oral Oral Oral Oral  SpO2: 98%   99%  Weight:      Height:       General: alert, cooperative and no distress Lochia: appropriate Uterine Fundus: firm Incision: Healing well with no significant drainage DVT Evaluation: No evidence of DVT seen on physical exam. Labs: Lab Results  Component Value Date   WBC 11.1 (H) 09/30/2017   HGB 8.4 (L) 09/30/2017   HCT 27.1 (L) 09/30/2017   MCV 86.6 09/30/2017   PLT 217 09/30/2017   CMP Latest Ref Rng & Units 09/27/2017  Glucose 65 - 99 mg/dL 782(N114(H)  BUN 6 - 20 mg/dL 7  Creatinine 5.620.57 - 1.301.00 mg/dL  8.650.67  Sodium 784134 - 696144 mmol/L 136  Potassium 3.5 - 5.2 mmol/L 4.0  Chloride 96 - 106 mmol/L 103  CO2 20 - 29 mmol/L 16(L)  Calcium 8.7 - 10.2 mg/dL 9.2  Total Protein 6.0 - 8.5 g/dL 6.8  Total Bilirubin 0.0 - 1.2 mg/dL <2.9<0.2  Alkaline Phos 39 - 117 IU/L 272(H)  AST 0 - 40 IU/L 16  ALT 0 - 32 IU/L 12    Discharge instruction: per After Visit Summary and "Baby and Me Booklet".  After visit meds:  Allergies as of 10/01/2017   No Known Allergies     Medication List    TAKE these medications   calcium carbonate 500 MG chewable tablet Commonly known as:  TUMS - dosed in mg elemental calcium Chew 2 tablets by mouth 2 (two) times daily as needed for indigestion or heartburn.   Doxylamine-Pyridoxine 10-10 MG Tbec Take 2 tabs at bedtime. If symptoms persist after  2 days, take 1 tab in the morning and 2 tabs at bedtime   ferrous sulfate 325 (65 FE) MG tablet Take 1 tablet (325 mg total) by mouth 2 (two) times daily with a meal. What changed:  when to take this   oxyCODONE-acetaminophen 5-325 MG tablet Commonly known as:  PERCOCET/ROXICET Take 1 tablet by mouth every 4 (four) hours as needed (pain scale 4-7).   PRENATAL VITAMIN PO Take 1 tablet by mouth daily.   valACYclovir 1000 MG tablet Commonly known as:  VALTREX Take 1 tablet (1,000 mg total) by mouth 2 (two) times daily.       Diet: routine diet  Activity: Advance as tolerated. Pelvic rest for 6 weeks.   Outpatient follow up:4 weeks  Follow up Appt: Future Appointments  Date Time Provider Department Center  10/12/2017 11:30 AM Reva BoresPratt, Tanya S, MD [facility]  11/03/2017 11:00 AM Dan[place] BingPickens, Charlie, MD [facility]   Follow up Visit:No Follow-up on file.  Postpartum contraception: Progesterone only pills  Newborn Data: Live born female  Birth Weight: 5 lb 15.8 oz (2715 g) APGAR: 9, 10  Newborn Delivery   Birth date/time:  09/28/2017 22:36:00 Delivery type:  C-Section, Low  Transverse C-section categorization:  Primary     Baby Feeding: Breast Disposition:home with mother   10/01/2017 Lovena NeighboursAbdoulaye Ryenn Howeth, MD

## 2017-10-04 ENCOUNTER — Encounter: Payer: BLUE CROSS/BLUE SHIELD | Admitting: Obstetrics & Gynecology

## 2017-10-05 ENCOUNTER — Encounter: Payer: Self-pay | Admitting: Obstetrics & Gynecology

## 2017-10-05 ENCOUNTER — Ambulatory Visit (INDEPENDENT_AMBULATORY_CARE_PROVIDER_SITE_OTHER): Payer: BLUE CROSS/BLUE SHIELD | Admitting: Obstetrics & Gynecology

## 2017-10-05 VITALS — BP 136/87 | HR 61 | Wt 135.8 lb

## 2017-10-05 DIAGNOSIS — O9122 Nonpurulent mastitis associated with the puerperium: Secondary | ICD-10-CM

## 2017-10-05 MED ORDER — CEPHALEXIN 500 MG PO CAPS: 500.0000 mg | ORAL_CAPSULE | Freq: Four times a day (QID) | ORAL | 2 refills | Status: DC

## 2017-10-05 NOTE — Patient Instructions (Signed)
Return to clinic for any scheduled appointments or obstetric concerns, or go to MAU for evaluation   Mastitis Mastitis is inflammation of the breast tissue. It occurs most often in women who are breastfeeding, but it can also affect other women, and even sometimes men. What are the causes? Mastitis is usually caused by a bacterial infection. Bacteria enter the breast tissue through cuts or openings in the skin. Typically, this occurs with breastfeeding because of cracked or irritated skin. Sometimes, it can occur even when there is no opening in the skin. It can be associated with plugged milk (lactiferous) ducts. Nipple piercing can also lead to mastitis. Also, some forms of breast cancer can cause mastitis. What are the signs or symptoms?  Swelling, redness, tenderness, and pain in an area of the breast.  Swelling of the glands under the arm on the same side.  Fever. If an infection is allowed to progress, a collection of pus (abscess) may develop. How is this diagnosed? Your health care provider can usually diagnose mastitis based on your symptoms and a physical exam. Tests may be done to help confirm the diagnosis. These may include:  Removal of pus from the breast by applying pressure to the area. This pus can be examined in the lab to determine which bacteria are present. If an abscess has developed, the fluid in the abscess can be removed with a needle. This can also be used to confirm the diagnosis and determine the bacteria present. In most cases, pus will not be present.  Blood tests to determine if your body is fighting a bacterial infection.  Mammogram or ultrasound tests to rule out other problems or diseases.  How is this treated? Antibiotic medicine is used to treat a bacterial infection. Your health care provider will determine which bacteria are most likely causing the infection and will select an appropriate antibiotic. This is sometimes changed based on the results of  tests performed to identify the bacteria, or if there is no response to the antibiotic selected. Antibiotics are usually given by mouth. You may also be given medicine for pain. Mastitis that occurs with breastfeeding will sometimes go away on its own, so your health care provider may choose to wait 24 hours after first seeing you to decide whether a prescription medicine is needed. Follow these instructions at home:  Only take over-the-counter or prescription medicines for pain, fever, or discomfort as directed by your health care provider.  If your health care provider prescribed an antibiotic, take the medicine as directed. Make sure you finish it even if you start to feel better.  Do not wear a tight or underwire bra. Wear a soft, supportive bra.  Increase your fluid intake, especially if you have a fever.  Women who are breastfeeding should follow these instructions: ? Continue to empty the breast. Your health care provider can tell you whether this milk is safe for your infant or needs to be thrown out. You may be told to stop nursing until your health care provider thinks it is safe for your baby. Use a breast pump if you are advised to stop nursing. ? Keep your nipples clean and dry. ? Empty the first breast completely before going to the other breast. If your baby is not emptying your breasts completely for some reason, use a breast pump to empty your breasts. ? If you go back to work, pump your breasts while at work to stay in time with your nursing schedule. ? Avoid allowing  your breasts to become overly filled with milk (engorged). Contact a health care provider if:  You have pus-like discharge from the breast.  Your symptoms do not improve with the treatment prescribed by your health care provider within 2 days. Get help right away if:  Your pain and swelling are getting worse.  You have pain that is not controlled with medicine.  You have a red line extending from the  breast toward your armpit.  You have a fever or persistent symptoms for more than 2-3 days.  You have a fever and your symptoms suddenly get worse. This information is not intended to replace advice given to you by your health care provider. Make sure you discuss any questions you have with your health care provider. Document Released: 10/19/2005 Document Revised: 03/26/2016 Document Reviewed: 05/19/2013 Elsevier Interactive Patient Education  2017 [employer]

## 2017-10-05 NOTE — Progress Notes (Signed)
   GYNECOLOGY OFFICE VISIT NOTE  History:  21 y.o. G1P1001 here today for evaluation of mastitis of left breast. She is pumping and noticed that she is unable to empty left breast and has been getting very painful and warm to touch. No purulent drainage, no fevers. No problems with right breast. She is one week s/p cesarean section, also wants help in removing incisional dressing. She denies any abnormal vaginal discharge, pelvic pain or other concerns.   Past Medical History:  Diagnosis Date  . Medical history non-contributory     Past Surgical History:  Procedure Laterality Date  . CESAREAN SECTION N/A 09/28/2017   Procedure: CESAREAN SECTION;  Surgeon: Levie HeritageStinson, Jacob J, DO;  [facility];  Service: Obstetrics;  Laterality: N/A;  . NO PAST SURGERIES      The following portions of the patient's history were reviewed and updated as appropriate: allergies, current medications, past family history, past medical history, past social history, past surgical history and problem list.    Review of Systems:  Pertinent items noted in HPI and remainder of comprehensive ROS otherwise negative.  Objective:  Physical Exam BP 136/87   Pulse 61   Wt 135 lb 12.8 oz (61.6 kg)   LMP 12/30/2016 (Exact Date)   BMI 25.66 kg/m  CONSTITUTIONAL: Well-developed, well-nourished female in no acute distress.  SKIN: Skin is warm and dry. No rash noted. Not diaphoretic. No erythema. No pallor. BREASTS: Symmetric in size. No erythema but left breast is very warm to touch and very engorged. No masses, skin changes, nipple drainage, or lymphadenopathy bilaterally.  ABDOMEN: Honeycomb dressing and steristrips removed. Incision C/D/I, no erythema   Assessment & Plan:  1. Mastitis during puerperium Prescribed Keflex for now, will reevaluate in one week - cephALEXin (KEFLEX) 500 MG capsule; Take 1 capsule (500 mg total) by mouth 4 (four) times daily.  Dispense: 40 capsule; Refill: 2  Please  refer to After Visit Summary for other counseling recommendations.   Return in about 1 week (around 10/12/2017) for Followup.   Jaynie CollinsUGONNA  Derk Doubek, MD, FACOG Attending Obstetrician & Gynecologist, [facility] Center for [facility], [facility]

## 2017-10-12 ENCOUNTER — Ambulatory Visit: Payer: BLUE CROSS/BLUE SHIELD | Admitting: Family Medicine

## 2017-10-20 ENCOUNTER — Inpatient Hospital Stay (HOSPITAL_COMMUNITY)
Admission: AD | Admit: 2017-10-20 | Discharge: 2017-10-20 | Disposition: A | Discharge status: Home / Self Care | Payer: BLUE CROSS/BLUE SHIELD | Source: Ambulatory Visit | Attending: Obstetrics & Gynecology | Admitting: Obstetrics & Gynecology

## 2017-10-20 ENCOUNTER — Other Ambulatory Visit: Payer: Self-pay

## 2017-10-20 DIAGNOSIS — N939 Abnormal uterine and vaginal bleeding, unspecified: Secondary | ICD-10-CM | POA: Diagnosis not present

## 2017-10-20 DIAGNOSIS — O9089 Other complications of the puerperium, not elsewhere classified: Secondary | ICD-10-CM | POA: Insufficient documentation

## 2017-10-20 DIAGNOSIS — G8918 Other acute postprocedural pain: Secondary | ICD-10-CM | POA: Diagnosis not present

## 2017-10-20 LAB — URINALYSIS, ROUTINE W REFLEX MICROSCOPIC
Bilirubin Urine: NEGATIVE
GLUCOSE, UA: NEGATIVE mg/dL
KETONES UR: NEGATIVE mg/dL
NITRITE: NEGATIVE
PH: 5 (ref 5.0–8.0)
Protein, ur: NEGATIVE mg/dL
Specific Gravity, Urine: 1.023 (ref 1.005–1.030)

## 2017-10-20 LAB — CBC WITH DIFFERENTIAL/PLATELET
BASOS ABS: 0 10*3/uL (ref 0.0–0.1)
BASOS PCT: 0 %
EOS ABS: 0.1 10*3/uL (ref 0.0–0.7)
Eosinophils Relative: 2 %
HCT: 36.9 % (ref 36.0–46.0)
Hemoglobin: 11.4 g/dL — ABNORMAL LOW (ref 12.0–15.0)
LYMPHS ABS: 2.9 10*3/uL (ref 0.7–4.0)
LYMPHS PCT: 58 %
MCH: 26.4 pg (ref 26.0–34.0)
MCHC: 30.9 g/dL (ref 30.0–36.0)
MCV: 85.4 fL (ref 78.0–100.0)
MONO ABS: 0.3 10*3/uL (ref 0.1–1.0)
Monocytes Relative: 6 %
NEUTROS PCT: 34 %
Neutro Abs: 1.7 10*3/uL (ref 1.7–7.7)
Other: 0 %
PLATELETS: 357 10*3/uL (ref 150–400)
RBC: 4.32 MIL/uL (ref 3.87–5.11)
RDW: 15.4 % (ref 11.5–15.5)
WBC: 5 10*3/uL (ref 4.0–10.5)

## 2017-10-20 NOTE — MAU Note (Addendum)
Had a c/s 3 wks ago, (+HSV and cholestasis).  Having really bad headaches, pain at the incisional area, heavy vag bleeding, and occ abd cramps (not currently).

## 2017-10-20 NOTE — Discharge Instructions (Signed)

## 2017-10-20 NOTE — MAU Provider Note (Signed)
History     CSN: [id number]  Arrival date and time: 10/20/17 1646   First Provider Initiated Contact with Patient 10/20/17 1857      Chief Complaint  Patient presents with  . Abdominal Pain  . Vaginal Bleeding  . Headache   HPI  [name] is a 21 y.o. G1P1001 who is 3 weeks S/P C/S who presents to MAU today with complaint of incisional pain and increased vaginal bleeding. She states bleeding is more than recently. She has not soaked a pad today. She states pain has increased over the last few days. She is not taking anything for pain. She is unsure about redness or issues with the incision because she "can't see it."   OB History    Gravida Para Term Preterm AB Living   1 1 1  0 0 1   SAB TAB Ectopic Multiple Live Births   0 0 0 0 1      Past Medical History:  Diagnosis Date  . Medical history non-contributory     Past Surgical History:  Procedure Laterality Date  . CESAREAN SECTION N/A 09/28/2017   Procedure: CESAREAN SECTION;  Surgeon: Levie HeritageStinson, Jacob J, DO;  [facility];  Service: Obstetrics;  Laterality: N/A;  . NO PAST SURGERIES      Family History  Problem Relation Age of Onset  . Hypertension Mother   . Hypertension Father   . Heart disease Paternal Aunt   . Hypertension Paternal Grandmother   . Diabetes Paternal Grandmother   . Cancer Paternal Grandmother   . Hypertension Paternal Grandfather     Social History   Tobacco Use  . Smoking status: Never Smoker  . Smokeless tobacco: Never Used  Substance Use Topics  . Alcohol use: No    Comment: occasional  . Drug use: No    Allergies: No Known Allergies  No medications prior to admission.    Review of Systems  Constitutional: Negative for fever.  Gastrointestinal: Negative for abdominal pain, constipation, diarrhea, nausea and vomiting.  Genitourinary: Positive for pelvic pain and vaginal bleeding. Negative for vaginal discharge.   Physical Exam   Blood pressure  116/81, pulse 87, temperature 98.8 F (37.1 C), resp. rate 16, SpO2 100 %, currently breastfeeding.  Physical Exam  Nursing note and vitals reviewed. Constitutional: She is oriented to person, place, and time. She appears well-developed and well-nourished. No distress.  HENT:  Head: Normocephalic and atraumatic.  Cardiovascular: Normal rate.  Respiratory: Effort normal.  GI: Soft. She exhibits no distension and no mass. There is no tenderness. There is no rebound and no guarding.  Incision is well healed. No erythema, edema, drainage or bleeding. Very mild tenderness to palpation surrounding the incision.   Neurological: She is alert and oriented to person, place, and time.  Skin: Skin is warm and dry. No erythema.  Psychiatric: She has a normal mood and affect.    Results for orders placed or performed during the hospital encounter of 10/20/17 (from the past 24 hour(s))  Urinalysis, Routine w reflex microscopic     Status: Abnormal   Collection Time: 10/20/17  5:36 PM  Result Value Ref Range   Color, Urine YELLOW YELLOW   APPearance HAZY (A) CLEAR   Specific Gravity, Urine 1.023 1.005 - 1.030   pH 5.0 5.0 - 8.0   Glucose, UA NEGATIVE NEGATIVE mg/dL   Hgb urine dipstick MODERATE (A) NEGATIVE   Bilirubin Urine NEGATIVE NEGATIVE   Ketones, ur NEGATIVE NEGATIVE mg/dL  Protein, ur NEGATIVE NEGATIVE mg/dL   Nitrite NEGATIVE NEGATIVE   Leukocytes, UA TRACE (A) NEGATIVE   RBC / HPF 0-5 0 - 5 RBC/hpf   WBC, UA 6-30 0 - 5 WBC/hpf   Bacteria, UA RARE (A) NONE SEEN   Squamous Epithelial / LPF 0-5 (A) NONE SEEN   Mucus PRESENT   CBC with Differential/Platelet     Status: Abnormal   Collection Time: 10/20/17  5:37 PM  Result Value Ref Range   WBC 5.0 4.0 - 10.5 K/uL   RBC 4.32 3.87 - 5.11 MIL/uL   Hemoglobin 11.4 (L) 12.0 - 15.0 g/dL   HCT 16.136.9 09.636.0 - 04.546.0 %   MCV 85.4 78.0 - 100.0 fL   MCH 26.4 26.0 - 34.0 pg   MCHC 30.9 30.0 - 36.0 g/dL   RDW 40.915.4 81.111.5 - 91.415.5 %   Platelets 357  150 - 400 K/uL   Neutrophils Relative % 34 %   Lymphocytes Relative 58 %   Monocytes Relative 6 %   Eosinophils Relative 2 %   Basophils Relative 0 %   Other 0 %   Neutro Abs 1.7 1.7 - 7.7 K/uL   Lymphs Abs 2.9 0.7 - 4.0 K/uL   Monocytes Absolute 0.3 0.1 - 1.0 K/uL   Eosinophils Absolute 0.1 0.0 - 0.7 K/uL   Basophils Absolute 0.0 0.0 - 0.1 K/uL    MAU Course  Procedures None  MDM UA, CBC today   Assessment and Plan  A: Post op pain Vaginal bleeding   P Discharge home in stable condition  Ibuprofen PRN for pain  Warning signs for worsening condition discussed Patient advised to follow-up with [facility]- as scheduled for routine PP visit 11/03/17 Patient may return to MAU as needed or if her condition were to change or worsen  Vonzella NippleJulie Wenzel, PA-C 10/20/2017, 7:36 PM

## 2017-11-03 ENCOUNTER — Encounter: Payer: Self-pay | Admitting: Obstetrics and Gynecology

## 2017-11-03 ENCOUNTER — Other Ambulatory Visit (HOSPITAL_COMMUNITY)
Admission: RE | Admit: 2017-11-03 | Discharge: 2017-11-03 | Disposition: A | Discharge status: Home / Self Care | Payer: BLUE CROSS/BLUE SHIELD | Source: Ambulatory Visit | Attending: Obstetrics and Gynecology | Admitting: Obstetrics and Gynecology

## 2017-11-03 ENCOUNTER — Ambulatory Visit (INDEPENDENT_AMBULATORY_CARE_PROVIDER_SITE_OTHER): Payer: BLUE CROSS/BLUE SHIELD | Admitting: Obstetrics and Gynecology

## 2017-11-03 VITALS — BP 123/83 | HR 74 | Wt 121.1 lb

## 2017-11-03 DIAGNOSIS — Z124 Encounter for screening for malignant neoplasm of cervix: Secondary | ICD-10-CM | POA: Diagnosis not present

## 2017-11-03 DIAGNOSIS — Z1389 Encounter for screening for other disorder: Secondary | ICD-10-CM

## 2017-11-03 DIAGNOSIS — A6009 Herpesviral infection of other urogenital tract: Secondary | ICD-10-CM | POA: Insufficient documentation

## 2017-11-03 HISTORY — DX: Herpesviral infection of other urogenital tract: A60.09

## 2017-11-03 NOTE — Progress Notes (Signed)
Obstetrics Visit Postpartum Visit  Appointment Date: 11/03/2017  OB[facility]  Primary Care Provider: Patient, No Pcp Per  Chief Complaint:  Chief Complaint  Patient presents with  . Postpartum Care    History of Present Illness: Deanna Perez is a 22 y.o. African-American G1P1001 (No LMP recorded.), seen for the above chief complaint. Her past medical history is significant for cholestasis and HSV   She is s/p pLTCS on 11/27 for HSV outbreak in the setting of cholestasis of pregnancy at term; she was discharged to home on POD#3  Vaginal bleeding or discharge: No  Breast or formula feeding: breast Intercourse: No  Contraception after delivery: abstinence PP depression s/s: No  Any bowel or bladder issues: No  Pap smear: never   Patient states she has no more s/s of ICP or hsv  Review of Systems:  as noted in the History of Present Illness.   Medications Merin Ouida SillsL. Imler had no medications administered during this visit. Current Outpatient Medications  Medication Sig Dispense Refill  . calcium carbonate (TUMS - DOSED IN MG ELEMENTAL CALCIUM) 500 MG chewable tablet Chew 2 tablets by mouth 2 (two) times daily as needed for indigestion or heartburn.    . Prenatal Vit-Fe Fumarate-FA (PRENATAL VITAMIN PO) Take 1 tablet by mouth daily.     . valACYclovir (VALTREX) 1000 MG tablet Take 1 tablet (1,000 mg total) by mouth 2 (two) times daily. 60 tablet 0  . cephALEXin (KEFLEX) 500 MG capsule Take 1 capsule (500 mg total) by mouth 4 (four) times daily. (Patient not taking: Reported on 11/03/2017) 40 capsule 2  . ferrous sulfate 325 (65 FE) MG tablet Take 1 tablet (325 mg total) by mouth 2 (two) times daily with a meal. (Patient not taking: Reported on 10/05/2017) 30 tablet 3  . oxyCODONE-acetaminophen (PERCOCET/ROXICET) 5-325 MG tablet Take 1 tablet by mouth every 4 (four) hours as needed (pain scale 4-7). (Patient not taking: Reported on 10/05/2017) 20  tablet 0   No current facility-administered medications for this visit.     Allergies Patient has no known allergies.  Physical Exam:  BP 123/83   Pulse 74   Wt 121 lb 1.6 oz (54.9 kg)   BMI 22.88 kg/m  Body mass index is 22.88 kg/m. General appearance: Well nourished, well developed female in no acute distress.  Respiratory:  Normal respiratory effort Abdomen: positive bowel sounds and no masses, hernias; diffusely non tender to palpation, non distended. Well healed low transverse skin incision Neuro/Psych:  Normal mood and affect.  Skin:  Warm and dry.  Lymphatic:  No inguinal lymphadenopathy.   Pelvic exam: is not limited by body habitus EGBUS: within normal limits Vagina: within normal limits and with no blood in the vault, Cervix:  no lesions or cervical motion tenderness Uterus:  nonenlarged and approximately 8 week sized Adnexa:  normal adnexa and no mass, fullness, tenderness Rectovaginal: deferred  Laboratory: none  PP Depression Screening:  epds 8  Assessment: pt doing well  Plan:  Pap smear done today. Long d/w pt and she declines BC b/c she doesn't want anything with implants or hormones, including the paragard. D/w her re: okay to tolac in future and to let us know if she changes her mind in re: to [facility]  RTC PRN  Cornelia Copaharlie Kyel Purk, Jr MD Attending Center for [facility](Faculty Practice)

## 2017-11-04 LAB — CYTOLOGY - PAP: DIAGNOSIS: NEGATIVE

## 2017-11-18 ENCOUNTER — Other Ambulatory Visit (HOSPITAL_COMMUNITY)
Admission: RE | Admit: 2017-11-18 | Discharge: 2017-11-18 | Disposition: A | Discharge status: Home / Self Care | Payer: BLUE CROSS/BLUE SHIELD | Source: Ambulatory Visit | Attending: Obstetrics & Gynecology | Admitting: Obstetrics & Gynecology

## 2017-11-18 ENCOUNTER — Other Ambulatory Visit: Payer: BLUE CROSS/BLUE SHIELD | Admitting: *Deleted

## 2017-11-18 DIAGNOSIS — N898 Other specified noninflammatory disorders of vagina: Secondary | ICD-10-CM | POA: Diagnosis not present

## 2017-11-18 NOTE — Progress Notes (Signed)
SUBJECTIVE:  22 y.o. female complains of white vaginal discharge for 3 day(s). Denies abnormal vaginal bleeding or significant pelvic pain or fever. No UTI symptoms. Denies history of known exposure to STD.  No LMP recorded.  OBJECTIVE:  She appears well, afebrile. Urine dipstick: negative for all components.  ASSESSMENT:  Vaginal Discharge  Vaginal Odor   PLAN:  GC, chlamydia, trichomonas, BVAG, CVAG probe sent to lab. Treatment: To be determined once lab results are received ROV prn if symptoms persist or worsen.

## 2017-11-19 NOTE — Progress Notes (Signed)
Agree with nursing staff's documentation of this patient's clinic encounter.  Ugonna Anyanwu, MD 11/19/2017 8:12 AM    

## 2017-11-20 LAB — CULTURE, URINE COMPREHENSIVE

## 2017-11-22 ENCOUNTER — Other Ambulatory Visit: Payer: Self-pay | Admitting: Obstetrics & Gynecology

## 2017-11-22 DIAGNOSIS — N760 Acute vaginitis: Principal | ICD-10-CM

## 2017-11-22 DIAGNOSIS — B9689 Other specified bacterial agents as the cause of diseases classified elsewhere: Secondary | ICD-10-CM

## 2017-11-22 LAB — CERVICOVAGINAL ANCILLARY ONLY
Bacterial vaginitis: POSITIVE — AB
CANDIDA VAGINITIS: NEGATIVE
Chlamydia: NEGATIVE
NEISSERIA GONORRHEA: NEGATIVE
TRICH (WINDOWPATH): NEGATIVE

## 2017-11-22 MED ORDER — METRONIDAZOLE 500 MG PO TABS: 500.0000 mg | ORAL_TABLET | Freq: Two times a day (BID) | ORAL | 0 refills | Status: DC

## 2017-12-10 ENCOUNTER — Other Ambulatory Visit: Payer: BLUE CROSS/BLUE SHIELD

## 2017-12-10 ENCOUNTER — Other Ambulatory Visit (HOSPITAL_COMMUNITY)
Admission: RE | Admit: 2017-12-10 | Discharge: 2017-12-10 | Disposition: A | Discharge status: Home / Self Care | Payer: BLUE CROSS/BLUE SHIELD | Source: Ambulatory Visit | Attending: Family Medicine | Admitting: Family Medicine

## 2017-12-10 VITALS — BP 128/70 | HR 72 | Wt 128.4 lb

## 2017-12-10 DIAGNOSIS — N760 Acute vaginitis: Secondary | ICD-10-CM | POA: Diagnosis not present

## 2017-12-10 DIAGNOSIS — B373 Candidiasis of vulva and vagina: Secondary | ICD-10-CM | POA: Insufficient documentation

## 2017-12-10 DIAGNOSIS — N898 Other specified noninflammatory disorders of vagina: Secondary | ICD-10-CM

## 2017-12-10 DIAGNOSIS — B9689 Other specified bacterial agents as the cause of diseases classified elsewhere: Secondary | ICD-10-CM | POA: Diagnosis not present

## 2017-12-10 NOTE — Progress Notes (Signed)
SUBJECTIVE:  22 y.o. female complains of clear vaginal discharge for a couple of days. Denies abnormal vaginal bleeding or significant pelvic pain or fever. No UTI symptoms. Denies history of known exposure to STD.  OBJECTIVE:  She appears well, afebrile.  ASSESSMENT:   Vaginal Discharge  Vaginal Odor   PLAN:  GC, chlamydia, trichomonas, BVAG, CVAG probe sent to lab. Treatment: To be determined once lab results are received ROV prn if symptoms persist or worsen.

## 2017-12-13 ENCOUNTER — Other Ambulatory Visit: Payer: Self-pay | Admitting: Family Medicine

## 2017-12-13 DIAGNOSIS — B373 Candidiasis of vulva and vagina: Secondary | ICD-10-CM

## 2017-12-13 DIAGNOSIS — B3731 Acute candidiasis of vulva and vagina: Secondary | ICD-10-CM

## 2017-12-13 DIAGNOSIS — N760 Acute vaginitis: Secondary | ICD-10-CM

## 2017-12-13 DIAGNOSIS — B9689 Other specified bacterial agents as the cause of diseases classified elsewhere: Secondary | ICD-10-CM

## 2017-12-13 LAB — CERVICOVAGINAL ANCILLARY ONLY
Bacterial vaginitis: POSITIVE — AB
CANDIDA VAGINITIS: POSITIVE — AB

## 2017-12-13 MED ORDER — FLUCONAZOLE 150 MG PO TABS: 150.0000 mg | ORAL_TABLET | Freq: Every day | ORAL | 2 refills | Status: DC

## 2017-12-13 MED ORDER — METRONIDAZOLE 500 MG PO TABS: 500.0000 mg | ORAL_TABLET | Freq: Two times a day (BID) | ORAL | 0 refills | Status: DC

## 2018-04-06 ENCOUNTER — Encounter: Payer: Self-pay | Admitting: *Deleted

## 2018-04-06 ENCOUNTER — Encounter: Payer: Self-pay | Admitting: Obstetrics and Gynecology

## 2018-04-06 ENCOUNTER — Ambulatory Visit (INDEPENDENT_AMBULATORY_CARE_PROVIDER_SITE_OTHER): Payer: BLUE CROSS/BLUE SHIELD | Admitting: Obstetrics and Gynecology

## 2018-04-06 ENCOUNTER — Other Ambulatory Visit (HOSPITAL_COMMUNITY)
Admission: RE | Admit: 2018-04-06 | Discharge: 2018-04-06 | Disposition: A | Discharge status: Home / Self Care | Payer: BLUE CROSS/BLUE SHIELD | Source: Ambulatory Visit | Attending: Obstetrics and Gynecology | Admitting: Obstetrics and Gynecology

## 2018-04-06 ENCOUNTER — Ambulatory Visit (INDEPENDENT_AMBULATORY_CARE_PROVIDER_SITE_OTHER): Payer: BLUE CROSS/BLUE SHIELD | Admitting: *Deleted

## 2018-04-06 VITALS — BP 134/86 | HR 84 | Ht 60.0 in | Wt 135.0 lb

## 2018-04-06 DIAGNOSIS — Z3201 Encounter for pregnancy test, result positive: Secondary | ICD-10-CM

## 2018-04-06 DIAGNOSIS — Z3687 Encounter for antenatal screening for uncertain dates: Secondary | ICD-10-CM | POA: Diagnosis not present

## 2018-04-06 DIAGNOSIS — N898 Other specified noninflammatory disorders of vagina: Secondary | ICD-10-CM | POA: Diagnosis not present

## 2018-04-06 DIAGNOSIS — Z124 Encounter for screening for malignant neoplasm of cervix: Secondary | ICD-10-CM | POA: Insufficient documentation

## 2018-04-06 LAB — POCT URINE PREGNANCY: PREG TEST UR: POSITIVE — AB

## 2018-04-06 NOTE — Progress Notes (Signed)
SUBJECTIVE:  22 y.o. female complains of white vaginal discharge and odor for 2 week(s). Denies abnormal vaginal bleeding or significant pelvic pain or fever. No UTI symptoms. Denies history of known exposure to STD.  Patient's last menstrual period was 01/31/2018.  OBJECTIVE:  She appears well, afebrile.   ASSESSMENT:  Vaginal Discharge  Vaginal Odor Early Pregnancy seen on bedside US   PLAN:  GC, chlamydia, trichomonas, BVAG, CVAG probe sent to lab. Treatment: To be determined once lab results are received ROV prn if symptoms persist or worsen and for NOB visit   DATING AND VIABILITY SONOGRAM   Deanna Perez is a 22 y.o. year old G1P1001 with LMP Patient's last menstrual period was 01/31/2018. which would correlate to  Unknown weeks gestation.  She has regular menstrual cycles.   She is here today for a confirmatory initial sonogram.    GESTATION: SINGLETON     FETAL ACTIVITY:          Heart rate: 178bpm          The fetus is active.  GESTATIONAL AGE AND  BIOMETRICS:  Gestational criteria: Estimated Date of Delivery: 11/07/2018 by LMP now at 01/31/18    CRL 2.30cm 9.0wks        AVERAGE EGA(BY THIS SCAN):  9.0 weeks  WORKING EDD( LMP ):  11/09/2018

## 2018-04-06 NOTE — Progress Notes (Signed)
UTI, Abnormal vaginal odor, and IUD today

## 2018-04-08 LAB — CERVICOVAGINAL ANCILLARY ONLY
Bacterial vaginitis: POSITIVE — AB
CANDIDA VAGINITIS: POSITIVE — AB
CHLAMYDIA, DNA PROBE: NEGATIVE
NEISSERIA GONORRHEA: NEGATIVE
TRICH (WINDOWPATH): NEGATIVE

## 2018-04-11 ENCOUNTER — Telehealth: Payer: Self-pay | Admitting: *Deleted

## 2018-04-11 DIAGNOSIS — B373 Candidiasis of vulva and vagina: Secondary | ICD-10-CM

## 2018-04-11 DIAGNOSIS — B3731 Acute candidiasis of vulva and vagina: Secondary | ICD-10-CM

## 2018-04-11 DIAGNOSIS — N760 Acute vaginitis: Principal | ICD-10-CM

## 2018-04-11 DIAGNOSIS — B9689 Other specified bacterial agents as the cause of diseases classified elsewhere: Secondary | ICD-10-CM

## 2018-04-11 MED ORDER — METRONIDAZOLE 500 MG PO TABS: 500.0000 mg | ORAL_TABLET | Freq: Two times a day (BID) | ORAL | 0 refills | Status: DC

## 2018-04-11 MED ORDER — TERCONAZOLE 0.4 % VA CREA
1.0000 | TOPICAL_CREAM | Freq: Every day | VAGINAL | 0 refills | Status: DC
Start: 2018-04-11 — End: 2018-04-27

## 2018-04-11 NOTE — Telephone Encounter (Signed)
-----   Message from Hamilton[place]itta Dawson, [place] sent at 04/09/2018  8:51 PM EDT ----- Please send 1) Rx for Terazol cream 0.4% 45 g tube vaginally hs x 5 days AND 2) Rx for Flagyl 500 mg BID x 7 days

## 2018-04-11 NOTE — Addendum Note (Signed)
Addended by: Arne ClevelandHUTCHINSON, MANDY J on: 04/11/2018 08:06 AM   Modules accepted: Orders

## 2018-04-11 NOTE — Telephone Encounter (Signed)
Tried calling patient to go over lab results and [facility] Unable to leave message

## 2018-04-15 ENCOUNTER — Telehealth: Payer: Self-pay | Admitting: *Deleted

## 2018-04-15 MED ORDER — DOXYLAMINE-PYRIDOXINE ER 20-20 MG PO TBCR: 1.0000 | EXTENDED_RELEASE_TABLET | Freq: Every day | ORAL | 3 refills | Status: DC

## 2018-04-15 NOTE — Telephone Encounter (Signed)
-----   Message from Lindell SparHeather L Bacon, [place] sent at 04/13/2018  3:18 PM EDT ----- Regarding: rx request  Contact: [phone] Would like rx for nausea and vomiting sent to [facility] on [address]

## 2018-04-27 ENCOUNTER — Other Ambulatory Visit (HOSPITAL_COMMUNITY)
Admission: RE | Admit: 2018-04-27 | Discharge: 2018-04-27 | Disposition: A | Discharge status: Home / Self Care | Payer: Medicaid Other | Source: Ambulatory Visit | Attending: Family Medicine | Admitting: Family Medicine

## 2018-04-27 ENCOUNTER — Ambulatory Visit (INDEPENDENT_AMBULATORY_CARE_PROVIDER_SITE_OTHER): Payer: Medicaid Other | Admitting: Family Medicine

## 2018-04-27 ENCOUNTER — Encounter: Payer: Self-pay | Admitting: Family Medicine

## 2018-04-27 VITALS — BP 118/75 | HR 100 | Wt 126.0 lb

## 2018-04-27 DIAGNOSIS — O98519 Other viral diseases complicating pregnancy, unspecified trimester: Secondary | ICD-10-CM

## 2018-04-27 DIAGNOSIS — Z3481 Encounter for supervision of other normal pregnancy, first trimester: Secondary | ICD-10-CM

## 2018-04-27 DIAGNOSIS — Z3480 Encounter for supervision of other normal pregnancy, unspecified trimester: Secondary | ICD-10-CM | POA: Diagnosis present

## 2018-04-27 DIAGNOSIS — Z98891 History of uterine scar from previous surgery: Secondary | ICD-10-CM | POA: Insufficient documentation

## 2018-04-27 DIAGNOSIS — O34219 Maternal care for unspecified type scar from previous cesarean delivery: Secondary | ICD-10-CM

## 2018-04-27 DIAGNOSIS — B009 Herpesviral infection, unspecified: Secondary | ICD-10-CM | POA: Insufficient documentation

## 2018-04-27 NOTE — Progress Notes (Signed)
Last pap 11/03/2017- normal

## 2018-04-27 NOTE — Progress Notes (Signed)
   PRENATAL VISIT NOTE  Subjective:  Deanna Perez is a 22 y.o. G2P1001 at [redacted]w[redacted]d being seen today for ongoing prenatal care.  She is currently monitored for the following issues for this high-risk pregnancy and has Supervision of other normal pregnancy, antepartum; HSV-2 infection complicating pregnancy; and Previous cesarean section complicating pregnancy on their problem list.  Patient reports no complaints.   .  .  Movement: Absent. Denies leaking of fluid.   The following portions of the patient's history were reviewed and updated as appropriate: allergies, current medications, past family history, past medical history, past social history, past surgical history and problem list. Problem list updated.  Objective:   Vitals:   04/27/18 1620  BP: 118/75  Pulse: 100  Weight: 126 lb (57.2 kg)    Fetal Status: Fetal Heart Rate (bpm): 166   Movement: Absent     General:  Alert, oriented and cooperative. Patient is in no acute distress.  Skin: Skin is warm and dry. No rash noted.   Cardiovascular: Normal heart rate noted  Respiratory: Normal respiratory effort, no problems with respiration noted  Abdomen: Soft, gravid, appropriate for gestational age.  Pain/Pressure: Absent     Pelvic: Cervical exam deferred        Extremities: Normal range of motion.     Mental Status: Normal mood and affect. Normal behavior. Normal judgment and thought content.   Assessment and Plan:  Pregnancy: G2P1001 at [redacted]w[redacted]d  1. Supervision of other normal pregnancy, antepartum - Will have new OB labs tomorrow (6/27) - Culture, OB Urine - Enroll patient in Babyscripts Program - US MFM OB COMP + 14 WK; Future - Interested in possible IUD  2. Previous cesarean section complicating pregnancy - Desires TOLAC- last CS was for HSV outbreak - Currently 7 months s/p CS-- will be ~12 months at the time of delivery - Discussed increased risk of uterine rupture  Preterm labor symptoms and general obstetric  precautions including but not limited to vaginal bleeding, contractions, leaking of fluid and fetal movement were reviewed in detail with the patient. Please refer to After Visit Summary for other counseling recommendations.   Return in about 1 month (around 05/25/2018) for Routine prenatal care.  Future Appointments  Date Time Provider Department Center  04/28/2018  1:00 PM [facility]  05/25/2018  1:45 PM Raelyn Moraawson, Rolitta, CNM [facility]    Federico FlakeKimberly Niles Raeli Wiens, MD

## 2018-04-28 ENCOUNTER — Other Ambulatory Visit: Payer: BLUE CROSS/BLUE SHIELD

## 2018-04-28 ENCOUNTER — Telehealth: Payer: Self-pay

## 2018-04-28 DIAGNOSIS — Z3480 Encounter for supervision of other normal pregnancy, unspecified trimester: Secondary | ICD-10-CM

## 2018-04-28 NOTE — Addendum Note (Signed)
Addended by: Cheree DittoGRAHAM, DEMETRICE A on: 04/28/2018 02:47 PM   Modules accepted: Kipp BroodSmartSet

## 2018-04-28 NOTE — Telephone Encounter (Signed)
Patient presented to the office today for new ob blood work and generic testing. Attempted to get blood work drawn but patient become very lightheaded so the blood work could not be completed. Patient was given a sprite and graham crackers to help with her symptoms. Blood pressure was taken and with normal range. 107/78. Patient waited for another 30 minutes without leaving the office. The patient was advised to give the office to call to let us know she made it home.

## 2018-04-29 LAB — GC/CHLAMYDIA PROBE AMP (~~LOC~~) NOT AT ARMC
CHLAMYDIA, DNA PROBE: NEGATIVE
Neisseria Gonorrhea: NEGATIVE

## 2018-04-29 LAB — CULTURE, OB URINE

## 2018-04-29 LAB — URINE CULTURE, OB REFLEX

## 2018-05-02 ENCOUNTER — Other Ambulatory Visit: Payer: BLUE CROSS/BLUE SHIELD

## 2018-05-02 DIAGNOSIS — O09521 Supervision of elderly multigravida, first trimester: Secondary | ICD-10-CM | POA: Diagnosis not present

## 2018-05-02 DIAGNOSIS — Z3481 Encounter for supervision of other normal pregnancy, first trimester: Secondary | ICD-10-CM | POA: Diagnosis not present

## 2018-05-04 LAB — OBSTETRIC PANEL, INCLUDING HIV
ANTIBODY SCREEN: NEGATIVE
BASOS: 0 %
Basophils Absolute: 0 10*3/uL (ref 0.0–0.2)
EOS (ABSOLUTE): 0.1 10*3/uL (ref 0.0–0.4)
EOS: 2 %
HEMATOCRIT: 34.4 % (ref 34.0–46.6)
HEMOGLOBIN: 11.7 g/dL (ref 11.1–15.9)
HEP B S AG: NEGATIVE
HIV Screen 4th Generation wRfx: NONREACTIVE
IMMATURE GRANULOCYTES: 0 %
Immature Grans (Abs): 0 10*3/uL (ref 0.0–0.1)
LYMPHS ABS: 1.7 10*3/uL (ref 0.7–3.1)
Lymphs: 40 %
MCH: 27.9 pg (ref 26.6–33.0)
MCHC: 34 g/dL (ref 31.5–35.7)
MCV: 82 fL (ref 79–97)
MONOS ABS: 0.6 10*3/uL (ref 0.1–0.9)
Monocytes: 14 %
NEUTROS ABS: 1.8 10*3/uL (ref 1.4–7.0)
NEUTROS PCT: 44 %
Platelets: 252 10*3/uL (ref 150–450)
RBC: 4.2 x10E6/uL (ref 3.77–5.28)
RDW: 13.3 % (ref 12.3–15.4)
RH TYPE: POSITIVE
RPR: NONREACTIVE
Rubella Antibodies, IGG: 4.25 index (ref >0.99)
WBC: 4.3 10*3/uL (ref 3.4–10.8)

## 2018-05-04 LAB — HEMOGLOBIN A1C
ESTIMATED AVERAGE GLUCOSE: 100 mg/dL
Hgb A1c MFr Bld: 5.1 % (ref 4.8–5.6)

## 2018-05-04 LAB — HEMOGLOBINOPATHY EVALUATION
HGB C: 0 %
HGB S: 0 %
HGB VARIANT: 0 %
Hemoglobin A2 Quantitation: 2.4 % (ref 1.8–3.2)
Hemoglobin F Quantitation: 0 % (ref 0.0–2.0)
Hgb A: 97.6 % (ref 96.4–98.8)

## 2018-05-04 LAB — CYSTIC FIBROSIS MUTATION 97: GENE DIS ANAL CARRIER INTERP BLD/T-IMP: NOT DETECTED

## 2018-05-09 LAB — SMN1 COPY NUMBER ANALYSIS (SMA CARRIER SCREENING)

## 2018-05-12 IMAGING — US US MFM OB FOLLOW-UP
1 series · 14 of 28 positions shown · non-contrast
Comparison: none

[Series 1: us mfm ob follow-up · 44 acquisitions, 14 frames shown]
[im 2/44]
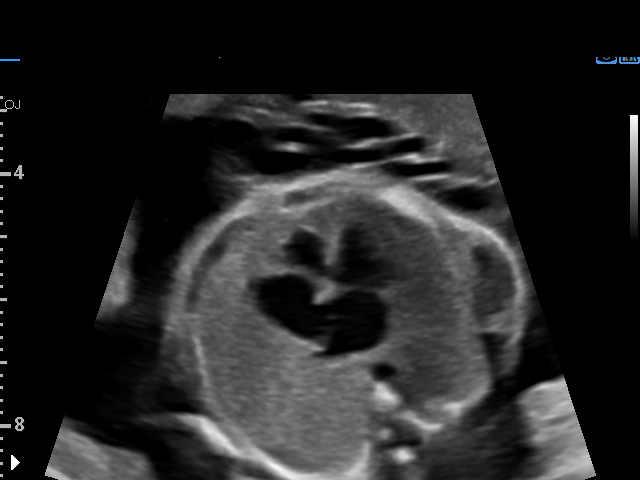
[im 5/44]
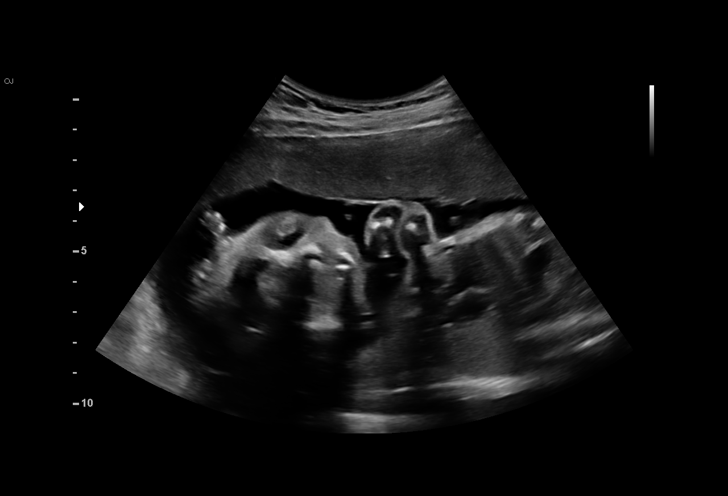
[im 8/44]
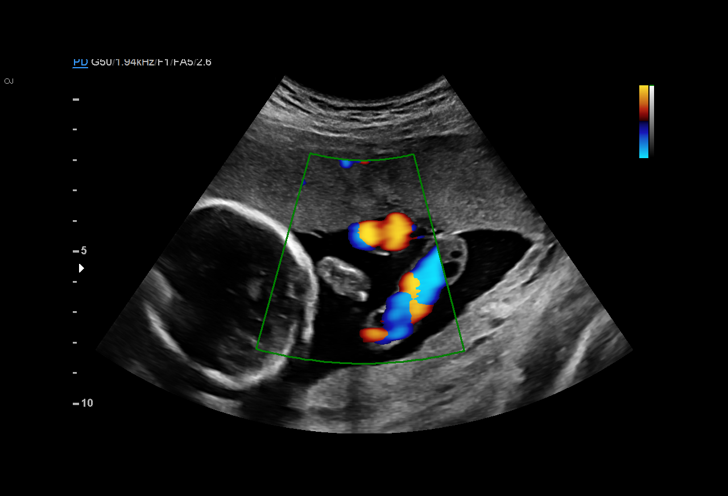
[im 12/44]
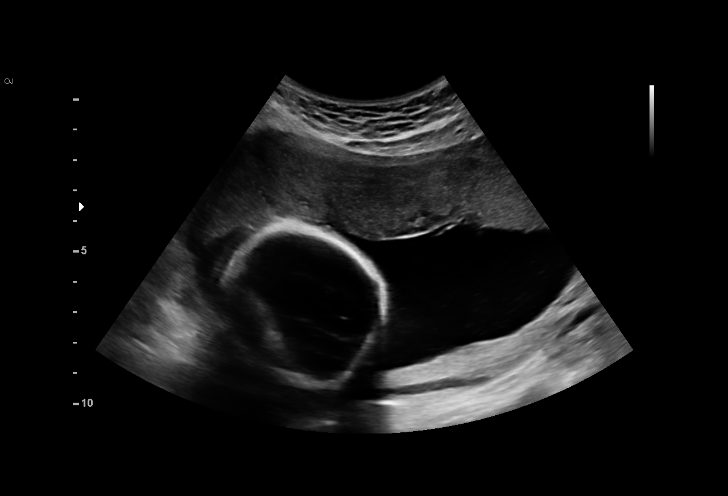
[im 15/44]
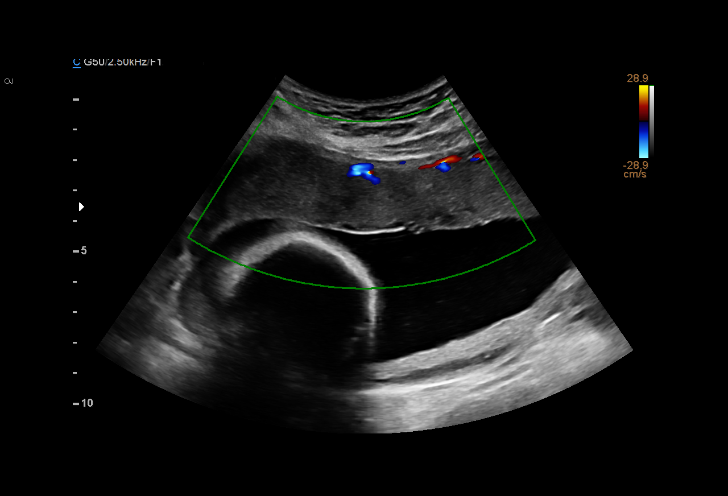
[im 18/44]
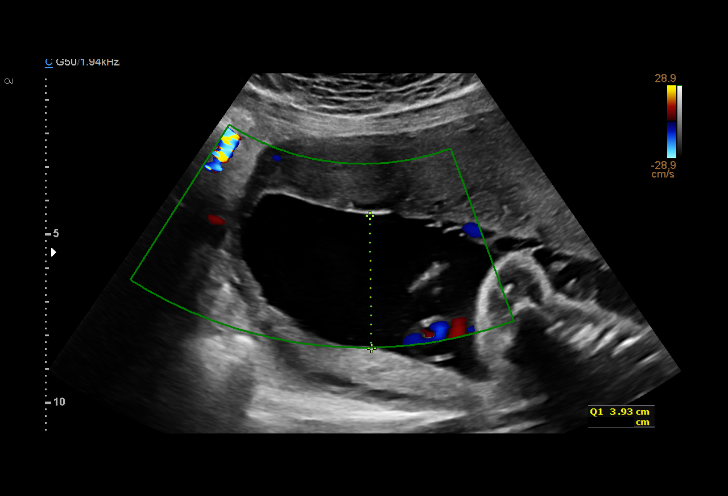
[im 21/44]
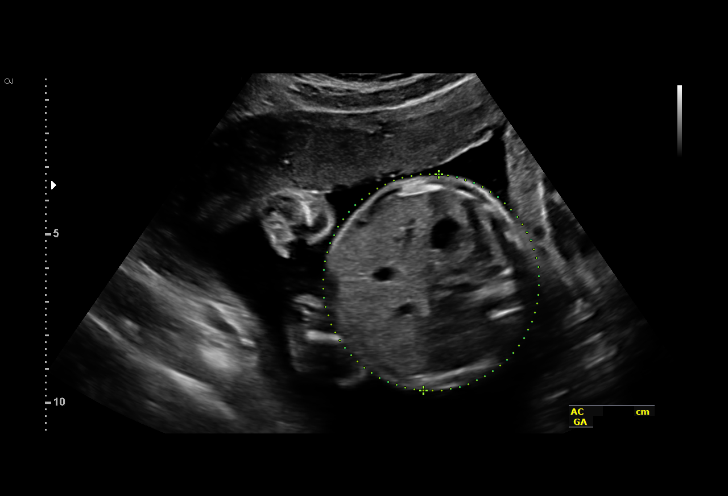
[im 24/44]
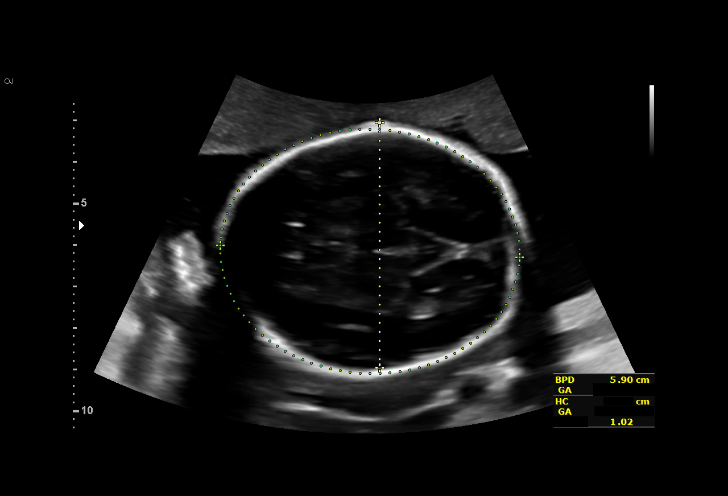
[im 28/44]
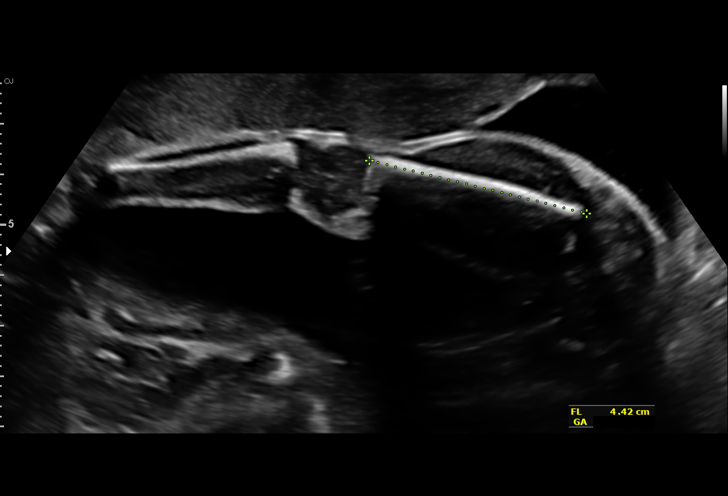
[im 31/44]
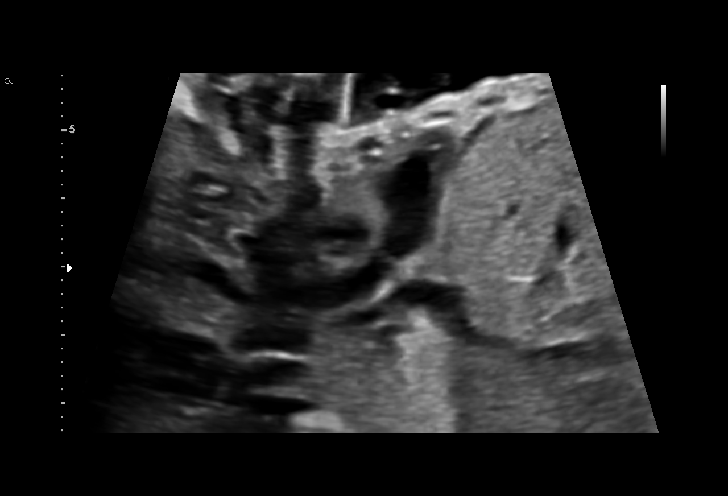
[im 34/44]
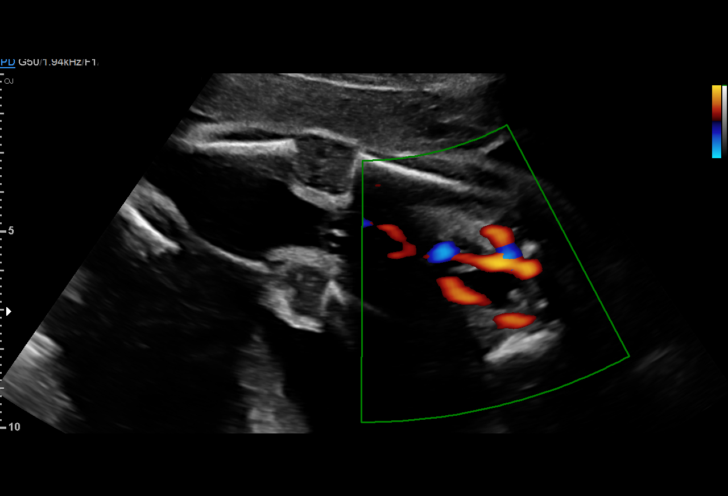
[im 37/44]
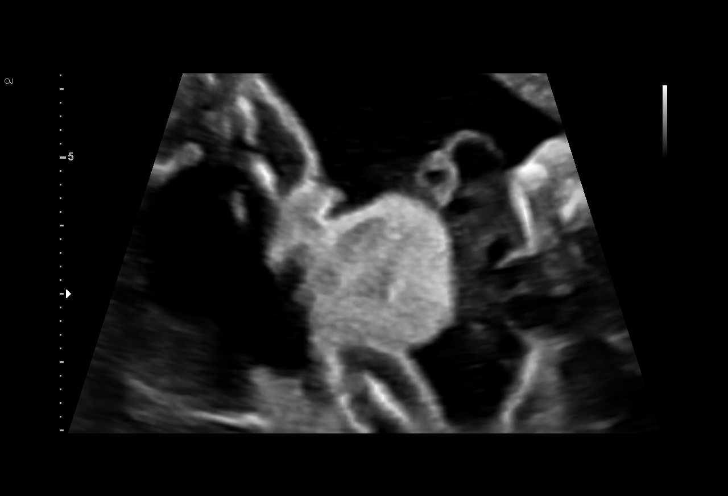
[im 40/44]
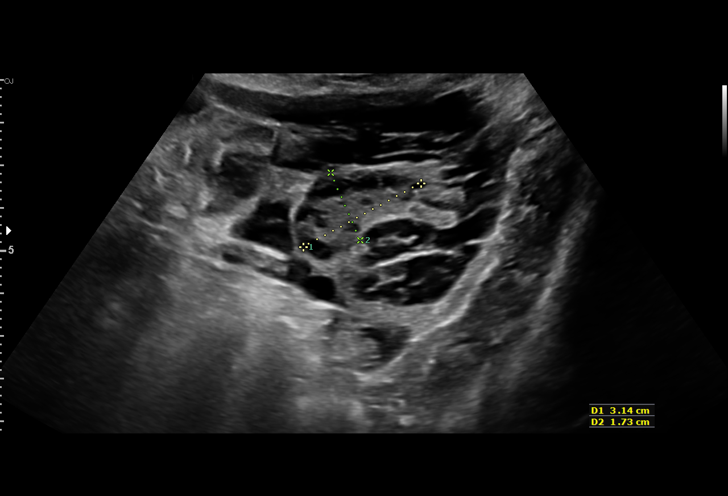
[im 44/44]
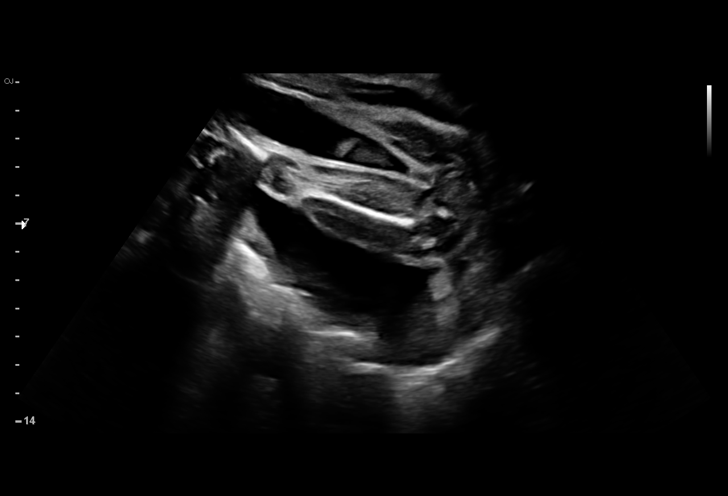

[14 of 28 positions shown; findings below may reference images not displayed]

1  BLANOVSCHII FANDOFAN              [id number]      [id number]     [id number]
Indications

24 weeks gestation of pregnancy
Vaginal bleeding in pregnancy, second
trimester(not currently bleeding)
OB History

Gravidity:    1
Fetal Evaluation

Num Of Fetuses:     1
Fetal Heart         161
Rate(bpm):
Cardiac Activity:   Observed
Presentation:       Frank breech
Placenta:           Anterior, above cervical os
P. Cord Insertion:  Visualized

Amniotic Fluid
AFI FV:      Subjectively within normal limits

AFI Sum(cm)     %Tile       Largest Pocket(cm)
3.93            < 3

RUQ(cm)
3.93
Biometry

BPD:      58.9  mm     G. Age:  24w 0d         35  %    CI:        79.43   %   70 - 86
FL/HC:      20.6   %   18.7 -
HC:      208.9  mm     G. Age:  23w 0d          3  %    HC/AC:      1.03       1.05 -
AC:      202.8  mm     G. Age:  24w 6d         60  %    FL/BPD:     73.0   %   71 - 87
FL:         43  mm     G. Age:  24w 1d         31  %    FL/AC:      21.2   %   20 - 24
CER:      26.7  mm     G. Age:  24w 2d         52  %

Est. FW:     688  gm      1 lb 8 oz     53  %
Gestational Age

LMP:           24w 2d       Date:   12/30/16                 EDD:   10/06/17
U/S Today:     24w 0d                                        EDD:   10/08/17
Best:          24w 2d    Det. By:   LMP  (12/30/16)          EDD:   10/06/17
Anatomy

Cranium:               Appears normal         Aortic Arch:            Previously seen
Cavum:                 Appears normal         Ductal Arch:            Previously seen
Ventricles:            Appears normal         Diaphragm:              Previously seen
Choroid Plexus:        Previously seen        Stomach:                Appears normal, left
sided
Cerebellum:            Appears normal         Abdomen:                Appears normal
Posterior Fossa:       Previously seen        Abdominal Wall:         Appears nml (cord
insert, abd wall)
Nuchal Fold:           Previously seen        Cord Vessels:           Appears normal (3
vessel cord)
Face:                  Orbits and profile     Kidneys:                Appear normal
previously seen
Lips:                  Previously seen        Bladder:                Appears normal
Thoracic:              Appears normal         Spine:                  Previously seen
Heart:                 Appears normal         Upper Extremities:      Previously seen
(4CH, axis, and situs
RVOT:                  Previously seen        Lower Extremities:      Previously seen
LVOT:                  Appears normal

Other:  Fetus appears to be a female. Heels and 5th digit prev seen.
Cervix Uterus Adnexa

Cervix
Length:              3  cm.
Normal appearance by transabdominal scan.

Uterus
No abnormality visualized.

Left Ovary
Within normal limits.

Right Ovary
Within normal limits.

Cul De Sac:   No free fluid seen.

Adnexa:       No abnormality visualized.
Impression

Singleton intrauterine pregnancy at 24+2 weeks with history
of vaginal bleeding
Interval review of the anatomy shows no sonographic
markers for aneuploidy or structural anomalies
Placentation remains normal
Amniotic fluid volume is normal
Estimated fetal weight is 688g which is growth in the 53rd
percentile
Recommendations

No changes in status since last scan
Continue clinical evaluation and management

## 2018-05-25 ENCOUNTER — Other Ambulatory Visit (HOSPITAL_COMMUNITY)
Admission: RE | Admit: 2018-05-25 | Discharge: 2018-05-25 | Disposition: A | Discharge status: Home / Self Care | Payer: Medicaid Other | Source: Ambulatory Visit | Attending: Obstetrics and Gynecology | Admitting: Obstetrics and Gynecology

## 2018-05-25 ENCOUNTER — Ambulatory Visit (INDEPENDENT_AMBULATORY_CARE_PROVIDER_SITE_OTHER): Payer: Medicaid Other | Admitting: Obstetrics and Gynecology

## 2018-05-25 VITALS — BP 102/64 | HR 77 | Wt 126.0 lb

## 2018-05-25 DIAGNOSIS — Z3A16 16 weeks gestation of pregnancy: Secondary | ICD-10-CM | POA: Insufficient documentation

## 2018-05-25 DIAGNOSIS — N898 Other specified noninflammatory disorders of vagina: Secondary | ICD-10-CM | POA: Insufficient documentation

## 2018-05-25 DIAGNOSIS — O34219 Maternal care for unspecified type scar from previous cesarean delivery: Secondary | ICD-10-CM

## 2018-05-25 DIAGNOSIS — O26892 Other specified pregnancy related conditions, second trimester: Secondary | ICD-10-CM | POA: Insufficient documentation

## 2018-05-25 DIAGNOSIS — N949 Unspecified condition associated with female genital organs and menstrual cycle: Secondary | ICD-10-CM | POA: Insufficient documentation

## 2018-05-25 MED ORDER — COMFORT FIT MATERNITY SUPP SM MISC: 1.0000 [IU] | Freq: Every day | 0 refills | Status: DC | PRN

## 2018-05-25 NOTE — Progress Notes (Signed)
Pt reports yellowish odor vaginal discharge x 1 wk; Left lower side pain on/off x 2wks.

## 2018-05-25 NOTE — Progress Notes (Signed)
   PRENATAL VISIT NOTE  Subjective:  Deanna Perez is a 22 y.o. G2P1001 at 5560w2d being seen today for ongoing prenatal care.  She is currently monitored for the following issues for this low-risk pregnancy and has Supervision of other normal pregnancy, antepartum; HSV-2 infection complicating pregnancy; Previous cesarean section complicating pregnancy; and Round ligament pain on their problem list.  Patient reports LLQ pain while working and yellowish vaginal discharge.  Contractions: Not present. Vag. Bleeding: None.  Movement: Absent. Denies leaking of fluid.   The following portions of the patient's history were reviewed and updated as appropriate: allergies, current medications, past family history, past medical history, past social history, past surgical history and problem list. Problem list updated.  Objective:   Vitals:   05/25/18 1347  BP: 102/64  Pulse: 77  Weight: 57.2 kg (126 lb)    Fetal Status: Fetal Heart Rate (bpm): 154 Fundal Height: 16 cm Movement: Absent     General:  Alert, oriented and cooperative. Patient is in no acute distress.  Skin: Skin is warm and dry. No rash noted.   Cardiovascular: Normal heart rate noted  Respiratory: Normal respiratory effort, no problems with respiration noted  Abdomen: Soft, gravid, appropriate for gestational age.  Pain/Pressure: Absent     Pelvic: Cervical exam deferred      Speculum not done, self-swab collection done per pt request  Extremities: Normal range of motion.  Edema: None  Mental Status: Normal mood and affect. Normal behavior. Normal judgment and thought content.   Assessment and Plan:  Pregnancy: G2P1001 at 560w2d  1. Vaginal discharge during pregnancy in second trimester - Cervicovaginal ancillary only -- by self swab  2. Round ligament pain - Rx for maternity belt given  3. Previous Cesarean Delivery Affecting Pregnancy - Information on second trimester pregnancy given  Preterm labor symptoms and  general obstetric precautions including but not limited to vaginal bleeding, contractions, leaking of fluid and fetal movement were reviewed in detail with the patient. Please refer to After Visit Summary for other counseling recommendations.  Return in about 1 month (around 06/22/2018) for Return OB visit.  Future Appointments  Date Time Provider Department Center  06/07/2018  1:30 PM [facility] US 1 [facility]    Raelyn Moraolitta Heavenlee Maiorana, CNM

## 2018-05-25 NOTE — Patient Instructions (Signed)
Second Trimester of Pregnancy The second trimester is from week 13 through week 28, month 4 through 6. This is often the time in pregnancy that you feel your best. Often times, morning sickness has lessened or quit. You may have more energy, and you may get hungry more often. Your unborn baby (fetus) is growing rapidly. At the end of the sixth month, he or she is about 9 inches long and weighs about 1 pounds. You will likely feel the baby move (quickening) between 18 and 20 weeks of pregnancy. Follow these instructions at home:  Avoid all smoking, herbs, and alcohol. Avoid drugs not approved by your doctor.  Do not use any tobacco products, including cigarettes, chewing tobacco, and electronic cigarettes. If you need help quitting, ask your doctor. You may get counseling or other support to help you quit.  Only take medicine as told by your doctor. Some medicines are safe and some are not during pregnancy.  Exercise only as told by your doctor. Stop exercising if you start having cramps.  Eat regular, healthy meals.  Wear a good support bra if your breasts are tender.  Do not use hot tubs, steam rooms, or saunas.  Wear your seat belt when driving.  Avoid raw meat, uncooked cheese, and liter boxes and soil used by cats.  Take your prenatal vitamins.  Take 1500-2000 milligrams of calcium daily starting at the 20th week of pregnancy until you deliver your baby.  Try taking medicine that helps you poop (stool softener) as needed, and if your doctor approves. Eat more fiber by eating fresh fruit, vegetables, and whole grains. Drink enough fluids to keep your pee (urine) clear or pale yellow.  Take warm water baths (sitz baths) to soothe pain or discomfort caused by hemorrhoids. Use hemorrhoid cream if your doctor approves.  If you have puffy, bulging veins (varicose veins), wear support hose. Raise (elevate) your feet for 15 minutes, 3-4 times a day. Limit salt in your diet.  Avoid heavy  lifting, wear low heals, and sit up straight.  Rest with your legs raised if you have leg cramps or low back pain.  Visit your dentist if you have not gone during your pregnancy. Use a soft toothbrush to brush your teeth. Be gentle when you floss.  You can have sex (intercourse) unless your doctor tells you not to.  Go to your doctor visits. Get help if:  You feel dizzy.  You have mild cramps or pressure in your lower belly (abdomen).  You have a nagging pain in your belly area.  You continue to feel sick to your stomach (nauseous), throw up (vomit), or have watery poop (diarrhea).  You have bad smelling fluid coming from your vagina.  You have pain with peeing (urination). Get help right away if:  You have a fever.  You are leaking fluid from your vagina.  You have spotting or bleeding from your vagina.  You have severe belly cramping or pain.  You lose or gain weight rapidly.  You have trouble catching your breath and have chest pain.  You notice sudden or extreme puffiness (swelling) of your face, hands, ankles, feet, or legs.  You have not felt the baby move in over an hour.  You have severe headaches that do not go away with medicine.  You have vision changes. This information is not intended to replace advice given to you by your health care provider. Make sure you discuss any questions you have with your health care   provider. Document Released: 01/13/2010 Document Revised: 03/26/2016 Document Reviewed: 12/20/2012 Elsevier Interactive Patient Education  2017 Elsevier Inc.  

## 2018-05-26 ENCOUNTER — Other Ambulatory Visit: Payer: Self-pay

## 2018-05-26 LAB — CERVICOVAGINAL ANCILLARY ONLY
BACTERIAL VAGINITIS: POSITIVE — AB
CANDIDA VAGINITIS: POSITIVE — AB

## 2018-05-26 MED ORDER — TERCONAZOLE 0.4 % VA CREA: 1.0000 | TOPICAL_CREAM | Freq: Every day | VAGINAL | 0 refills | Status: DC

## 2018-05-26 MED ORDER — METRONIDAZOLE 500 MG PO TABS: 500.0000 mg | ORAL_TABLET | Freq: Two times a day (BID) | ORAL | 0 refills | Status: DC

## 2018-05-26 NOTE — Telephone Encounter (Signed)
Called patient to inform her of positive test results for BV and yeast. No answer or voice mail to leave a message.

## 2018-05-31 ENCOUNTER — Encounter (HOSPITAL_COMMUNITY): Payer: Self-pay

## 2018-05-31 DIAGNOSIS — Z3009 Encounter for other general counseling and advice on contraception: Secondary | ICD-10-CM | POA: Diagnosis not present

## 2018-05-31 DIAGNOSIS — Z0389 Encounter for observation for other suspected diseases and conditions ruled out: Secondary | ICD-10-CM | POA: Diagnosis not present

## 2018-05-31 DIAGNOSIS — Z1388 Encounter for screening for disorder due to exposure to contaminants: Secondary | ICD-10-CM | POA: Diagnosis not present

## 2018-06-07 ENCOUNTER — Other Ambulatory Visit: Payer: Self-pay | Admitting: Family Medicine

## 2018-06-07 ENCOUNTER — Ambulatory Visit (HOSPITAL_COMMUNITY)
Admission: RE | Admit: 2018-06-07 | Discharge: 2018-06-07 | Disposition: A | Discharge status: Home / Self Care | Payer: Medicaid Other | Source: Ambulatory Visit | Attending: Family Medicine | Admitting: Family Medicine

## 2018-06-07 DIAGNOSIS — O34219 Maternal care for unspecified type scar from previous cesarean delivery: Secondary | ICD-10-CM

## 2018-06-07 DIAGNOSIS — Z363 Encounter for antenatal screening for malformations: Secondary | ICD-10-CM

## 2018-06-07 DIAGNOSIS — Z3480 Encounter for supervision of other normal pregnancy, unspecified trimester: Secondary | ICD-10-CM | POA: Diagnosis present

## 2018-06-07 DIAGNOSIS — O09892 Supervision of other high risk pregnancies, second trimester: Secondary | ICD-10-CM | POA: Diagnosis not present

## 2018-06-07 DIAGNOSIS — Z3A18 18 weeks gestation of pregnancy: Secondary | ICD-10-CM | POA: Diagnosis not present

## 2018-06-22 ENCOUNTER — Ambulatory Visit (INDEPENDENT_AMBULATORY_CARE_PROVIDER_SITE_OTHER): Payer: Medicaid Other | Admitting: Family Medicine

## 2018-06-22 VITALS — BP 103/67 | HR 87 | Wt 128.4 lb

## 2018-06-22 DIAGNOSIS — Z3482 Encounter for supervision of other normal pregnancy, second trimester: Secondary | ICD-10-CM

## 2018-06-22 DIAGNOSIS — Z3480 Encounter for supervision of other normal pregnancy, unspecified trimester: Secondary | ICD-10-CM

## 2018-06-22 DIAGNOSIS — O34219 Maternal care for unspecified type scar from previous cesarean delivery: Secondary | ICD-10-CM

## 2018-06-22 NOTE — Progress Notes (Signed)
    PRENATAL VISIT NOTE  Subjective:  Deanna Perez is a 22 y.o. G2P1001 at 7426w2d being seen today for ongoing prenatal care.  She is currently monitored for the following issues for this low-risk pregnancy and has Supervision of other normal pregnancy, antepartum; HSV-2 infection complicating pregnancy; Previous cesarean section complicating pregnancy; and Round ligament pain on their problem list.  Patient reports no complaints.  Contractions: Not present. Vag. Bleeding: None.  Movement: Present. Denies leaking of fluid.   The following portions of the patient's history were reviewed and updated as appropriate: allergies, current medications, past family history, past medical history, past social history, past surgical history and problem list. Problem list updated.  Objective:   Vitals:   06/22/18 1146  BP: 103/67  Pulse: 87  Weight: 128 lb 6.4 oz (58.2 kg)    Fetal Status: Fetal Heart Rate (bpm): 144   Movement: Present     General:  Alert, oriented and cooperative. Patient is in no acute distress.  Skin: Skin is warm and dry. No rash noted.   Cardiovascular: Normal heart rate noted  Respiratory: Normal respiratory effort, no problems with respiration noted  Abdomen: Soft, gravid, appropriate for gestational age.  Pain/Pressure: Absent     Pelvic: Cervical exam deferred        Extremities: Normal range of motion.  Edema: None  Mental Status: Normal mood and affect. Normal behavior. Normal judgment and thought content.   Assessment and Plan:  Pregnancy: G2P1001 at 7526w2d  1. Previous cesarean section complicating pregnancy   2. Supervision of other normal pregnancy, antepartum Continue routine prenatal care. Normal anatomy  General obstetric precautions including but not limited to vaginal bleeding, contractions, leaking of fluid and fetal movement were reviewed in detail with the patient. Please refer to After Visit Summary for other counseling recommendations.    Return in 4 weeks (on 07/20/2018).  Future Appointments  Date Time Provider Department Center  07/19/2018 10:30 AM Reva BoresPratt, Matilyn Fehrman S, MD [facility]    Reva Boresanya S Maicy Filip, MD

## 2018-06-22 NOTE — Progress Notes (Signed)
Patient request to wait on influenza vaccine.

## 2018-06-22 NOTE — Patient Instructions (Signed)

## 2018-07-08 ENCOUNTER — Inpatient Hospital Stay (HOSPITAL_COMMUNITY)
Admission: AD | Admit: 2018-07-08 | Discharge: 2018-07-08 | Disposition: A | Discharge status: Home / Self Care | Payer: Medicaid Other | Source: Ambulatory Visit | Attending: Obstetrics and Gynecology | Admitting: Obstetrics and Gynecology

## 2018-07-08 DIAGNOSIS — O26892 Other specified pregnancy related conditions, second trimester: Secondary | ICD-10-CM | POA: Insufficient documentation

## 2018-07-08 DIAGNOSIS — Z202 Contact with and (suspected) exposure to infections with a predominantly sexual mode of transmission: Secondary | ICD-10-CM | POA: Diagnosis not present

## 2018-07-08 DIAGNOSIS — Z3A22 22 weeks gestation of pregnancy: Secondary | ICD-10-CM | POA: Diagnosis not present

## 2018-07-08 DIAGNOSIS — O99512 Diseases of the respiratory system complicating pregnancy, second trimester: Secondary | ICD-10-CM | POA: Insufficient documentation

## 2018-07-08 DIAGNOSIS — R102 Pelvic and perineal pain: Secondary | ICD-10-CM | POA: Diagnosis not present

## 2018-07-08 DIAGNOSIS — J029 Acute pharyngitis, unspecified: Secondary | ICD-10-CM | POA: Insufficient documentation

## 2018-07-08 DIAGNOSIS — N949 Unspecified condition associated with female genital organs and menstrual cycle: Secondary | ICD-10-CM

## 2018-07-08 DIAGNOSIS — O34219 Maternal care for unspecified type scar from previous cesarean delivery: Secondary | ICD-10-CM | POA: Diagnosis not present

## 2018-07-08 LAB — WET PREP, GENITAL
CLUE CELLS WET PREP: NONE SEEN
Sperm: NONE SEEN
TRICH WET PREP: NONE SEEN
YEAST WET PREP: NONE SEEN

## 2018-07-08 MED ORDER — CEFTRIAXONE SODIUM 250 MG IJ SOLR
250.0000 mg | Freq: Once | INTRAMUSCULAR | Status: AC
  Administered 2018-07-08: 250 mg via INTRAMUSCULAR
  Filled 2018-07-08: qty 250

## 2018-07-08 MED ORDER — METRONIDAZOLE 500 MG PO TABS: ORAL_TABLET | ORAL | 0 refills | Status: DC

## 2018-07-08 MED ORDER — AZITHROMYCIN 250 MG PO TABS
1000.0000 mg | ORAL_TABLET | Freq: Once | ORAL | Status: AC
  Administered 2018-07-08: 1000 mg via ORAL
  Filled 2018-07-08: qty 4

## 2018-07-08 NOTE — MAU Note (Signed)
Found out today my partner was exposed to [facility]. Just want to be treated. Have been having yellow d/c for couple days. No odor or itching

## 2018-07-08 NOTE — MAU Note (Signed)
Sharen Counter CNM in Triage to discuss plan of care with pt. Pt self swabbed and treated with meds from Triage.

## 2018-07-08 NOTE — MAU Provider Note (Signed)
Chief Complaint: possible std   None     SUBJECTIVE HPI: Deanna Perez is a 22 y.o. G2P1001 at [redacted]w[redacted]d by LMP pt of [facility] [place] who presents to maternity admissions reporting her boyfriend was diagnosed with gonorrhea. She denies any vaginal symptoms or pelvic pain.  She does have sore throat and would like to be tested for oral gonorrhea/chlamydia.  There are no other symptoms. She has not tried any treatments.  She is feeling fetal movement.    HPI  Past Medical History:  Diagnosis Date  . Cholestasis during pregnancy in third trimester 09/27/2017  . Herpes genitalis in women 11/03/2017   Past Surgical History:  Procedure Laterality Date  . CESAREAN SECTION N/A 09/28/2017   Procedure: CESAREAN SECTION;  Surgeon: Levie Heritage, DO;  Location: [facility] BIRTHING SUITES;  Service: Obstetrics;  Laterality: N/A;   Social History   Socioeconomic History  . Marital status: Single    Spouse name: Not on file  . Number of children: Not on file  . Years of education: Not on file  . Highest education level: Not on file  Occupational History  . Not on file  Social Needs  . Financial resource strain: Not on file  . Food insecurity:    Worry: Not on file    Inability: Not on file  . Transportation needs:    Medical: Not on file    Non-medical: Not on file  Tobacco Use  . Smoking status: Never Smoker  . Smokeless tobacco: Never Used  Substance and Sexual Activity  . Alcohol use: No    Comment: occasional  . Drug use: No  . Sexual activity: Yes    Birth control/protection: Pill  Lifestyle  . Physical activity:    Days per week: Not on file    Minutes per session: Not on file  . Stress: Not on file  Relationships  . Social connections:    Talks on phone: Not on file    Gets together: Not on file    Attends religious service: Not on file    Active member of club or organization: Not on file    Attends meetings of clubs or organizations: Not on file    Relationship status: Not on  file  . Intimate partner violence:    Fear of current or ex partner: Not on file    Emotionally abused: Not on file    Physically abused: Not on file    Forced sexual activity: Not on file  Other Topics Concern  . Not on file  Social History Narrative  . Not on file   No current facility-administered medications on file prior to encounter.    Current Outpatient Medications on File Prior to Encounter  Medication Sig Dispense Refill  . calcium carbonate (TUMS - DOSED IN MG ELEMENTAL CALCIUM) 500 MG chewable tablet Chew 2 tablets by mouth 2 (two) times daily as needed for indigestion or heartburn.    . Doxylamine-Pyridoxine ER (BONJESTA) 20-20 MG TBCR Take 1 tablet by mouth at bedtime. May add 1 tab midday if Sx persist 60 tablet 3  . Elastic Bandages & Supports (COMFORT FIT MATERNITY SUPP SM) MISC 1 Units by Does not apply route daily as needed. (Patient not taking: Reported on 06/22/2018) 1 each 0  . Prenatal Vit-Fe Fumarate-FA (PRENATAL VITAMIN PO) Take 1 tablet by mouth daily.     . valACYclovir (VALTREX) 1000 MG tablet Take 1 tablet (1,000 mg total) by mouth 2 (two) times daily. 60  tablet 0   No Known Allergies  ROS:  Review of Systems  Constitutional: Negative for chills, fatigue and fever.  HENT: Positive for sore throat.   Respiratory: Negative for shortness of breath.   Cardiovascular: Negative for chest pain.  Genitourinary: Negative for difficulty urinating, dysuria, flank pain, pelvic pain, vaginal bleeding, vaginal discharge and vaginal pain.  Neurological: Negative for dizziness and headaches.  Psychiatric/Behavioral: Negative.      I have reviewed patient's Past Medical Hx, Surgical Hx, Family Hx, Social Hx, medications and allergies.   Physical Exam   Patient Vitals for the past 24 hrs:  BP Temp Pulse Resp Height Weight  07/08/18 2050 128/74 - - - - -  07/08/18 1924 127/69 98.2 F (36.8 C) 91 18 5\' 1"  (1.549 m) 59 kg   Constitutional: Well-developed,  well-nourished female in no acute distress.  Cardiovascular: normal rate Respiratory: normal effort GI: Abd soft, non-tender. Pos BS x 4 MS: Extremities nontender, no edema, normal ROM Neurologic: Alert and oriented x 4.  GU: Neg CVAT.  PELVIC EXAM: Pt self swabbed for genital cultures, RN/CNM swabbed orally in triage for [facility]  FHT 154 by doppler  LAB RESULTS Results for orders placed or performed during the hospital encounter of 07/08/18 (from the past 24 hour(s))  Wet prep, genital     Status: Abnormal   Collection Time: 07/08/18  8:29 PM  Result Value Ref Range   Yeast Wet Prep HPF POC NONE SEEN NONE SEEN   Trich, Wet Prep NONE SEEN NONE SEEN   Clue Cells Wet Prep HPF POC NONE SEEN NONE SEEN   WBC, Wet Prep HPF POC FEW (A) NONE SEEN   Sperm NONE SEEN     A/Positive/-- (06/27 1320)  IMAGING No results found.  MAU Management/MDM: Pt with known exposure to gonorrhea.  Treatment given with Rocephin 250 mg IM, azithromycin 1000 mg oral dose in MAU.  Rx for Flagyl 2 g x 1 dose.  HIV and RPR drawn today.  Pt negative at initial OB visit.  Offered expedited partner therapy. Partner already treated.  No intercourse x 10 days after treatment today.  Condoms to prevent reinfection. Pt discharged with strict return precautions.  Keep prenatal appts at [facility].  ASSESSMENT 1. Exposure to STD   2. Round ligament pain   3. Previous cesarean section complicating pregnancy   4. [redacted] weeks gestation of pregnancy   5. Acute sore throat     PLAN Discharge home Allergies as of 07/08/2018   No Known Allergies     Medication List    STOP taking these medications   valACYclovir 1000 MG tablet Commonly known as:  VALTREX     TAKE these medications   calcium carbonate 500 MG chewable tablet Commonly known as:  TUMS - dosed in mg elemental calcium Chew 2 tablets by mouth 2 (two) times daily as needed for indigestion or heartburn.   COMFORT FIT MATERNITY SUPP SM Misc 1 Units by Does not  apply route daily as needed.   Doxylamine-Pyridoxine ER 20-20 MG Tbcr Take 1 tablet by mouth at bedtime. May add 1 tab midday if Sx persist   metroNIDAZOLE 500 MG tablet Commonly known as:  FLAGYL Take two tablets by mouth twice a day, for one day.  Or you can take all four tablets at once if you can tolerate it.   PRENATAL VITAMIN PO Take 1 tablet by mouth daily.        Sharen Counter Certified Nurse-Midwife 07/08/2018  9:00 PM

## 2018-07-09 LAB — RPR: RPR Ser Ql: NONREACTIVE

## 2018-07-09 LAB — HIV ANTIBODY (ROUTINE TESTING W REFLEX): HIV Screen 4th Generation wRfx: NONREACTIVE

## 2018-07-11 LAB — GC/CHLAMYDIA PROBE AMP (~~LOC~~) NOT AT ARMC
CHLAMYDIA, DNA PROBE: NEGATIVE
Chlamydia: NEGATIVE
NEISSERIA GONORRHEA: POSITIVE — AB
Neisseria Gonorrhea: POSITIVE — AB

## 2018-07-14 ENCOUNTER — Encounter: Payer: Self-pay | Admitting: Advanced Practice Midwife

## 2018-07-14 DIAGNOSIS — O98212 Gonorrhea complicating pregnancy, second trimester: Secondary | ICD-10-CM | POA: Insufficient documentation

## 2018-07-19 ENCOUNTER — Ambulatory Visit (INDEPENDENT_AMBULATORY_CARE_PROVIDER_SITE_OTHER): Payer: Medicaid Other | Admitting: Family Medicine

## 2018-07-19 VITALS — BP 111/73 | HR 89 | Wt 129.2 lb

## 2018-07-19 DIAGNOSIS — O98512 Other viral diseases complicating pregnancy, second trimester: Secondary | ICD-10-CM

## 2018-07-19 DIAGNOSIS — O34219 Maternal care for unspecified type scar from previous cesarean delivery: Secondary | ICD-10-CM

## 2018-07-19 DIAGNOSIS — B009 Herpesviral infection, unspecified: Secondary | ICD-10-CM

## 2018-07-19 DIAGNOSIS — Z3480 Encounter for supervision of other normal pregnancy, unspecified trimester: Secondary | ICD-10-CM

## 2018-07-19 MED ORDER — VALACYCLOVIR HCL 1 G PO TABS: 1000.0000 mg | ORAL_TABLET | Freq: Every day | ORAL | 2 refills | Status: DC

## 2018-07-19 NOTE — Progress Notes (Signed)
   PRENATAL VISIT NOTE  Subjective:  Deanna Perez is a 22 y.o. G2P1001 at 873w1d being seen today for ongoing prenatal care.  She is currently monitored for the following issues for this low-risk pregnancy and has Supervision of other normal pregnancy, antepartum; HSV-2 infection complicating pregnancy; Previous cesarean section complicating pregnancy; Round ligament pain; and Gonorrhea affecting pregnancy, antepartum, second trimester on their problem list.  Patient reports no complaints.  Contractions: Not present. Vag. Bleeding: None.  Movement: Present. Denies leaking of fluid.   The following portions of the patient'Perez history were reviewed and updated as appropriate: allergies, current medications, past family history, past medical history, past social history, past surgical history and problem list. Problem list updated.  Objective:   Vitals:   07/19/18 1112  BP: 111/73  Pulse: 89  Weight: 129 lb 3.2 oz (58.6 kg)    Fetal Status: Fetal Heart Rate (bpm): 153 Fundal Height: 24 cm Movement: Present     General:  Alert, oriented and cooperative. Patient is in no acute distress.  Skin: Skin is warm and dry. No rash noted.   Cardiovascular: Normal heart rate noted  Respiratory: Normal respiratory effort, no problems with respiration noted  Abdomen: Soft, gravid, appropriate for gestational age.  Pain/Pressure: Absent     Pelvic: Cervical exam deferred        Extremities: Normal range of motion.  Edema: None  Mental Status: Normal mood and affect. Normal behavior. Normal judgment and thought content.   Assessment and Plan:  Pregnancy: G2P1001 at 2673w1d  1. Supervision of other normal pregnancy, antepartum Continue routine prenatal care. 28 wk labs next visit  2. Previous cesarean section complicating pregnancy Discuss delivery plan next visit.  3. 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 - valACYclovir (VALTREX) 1000 MG tablet; Take 1 tablet (1,000 mg total) by mouth daily. Then begin 1/2 po bid  Dispense: 60 tablet; Refill: 2  Preterm labor symptoms and general obstetric precautions including but not limited to vaginal bleeding, contractions, leaking of fluid and fetal movement were reviewed in detail with the patient. Please refer to After Visit Summary for other counseling recommendations.  Return in 4 weeks (on 08/16/2018) for 28 wk labs.  Future Appointments  Date Time Provider Department Center  08/16/2018  8:45 AM Deanna Perez, Deanna Sol Perez, Deanna Perez [facility]    Deanna Boresanya Perez [name], Deanna Perez

## 2018-07-19 NOTE — Progress Notes (Signed)
Patient request medication refill on Valtrex 1,000 mg PO BID.

## 2018-07-19 NOTE — Patient Instructions (Signed)

## 2018-07-25 ENCOUNTER — Telehealth: Payer: Self-pay

## 2018-07-25 NOTE — Telephone Encounter (Signed)
-----   Message from Lindell SparHeather L Bacon, [place] sent at 07/25/2018  2:03 PM EDT ----- Regarding: patient question Please call patient has questioning about possible needing a lab drawn

## 2018-07-25 NOTE — Telephone Encounter (Signed)
Returned patient call - LMOVMTC re possibly needing a lab drawn.

## 2018-08-04 ENCOUNTER — Ambulatory Visit (INDEPENDENT_AMBULATORY_CARE_PROVIDER_SITE_OTHER): Payer: Medicaid Other | Admitting: Obstetrics & Gynecology

## 2018-08-04 ENCOUNTER — Encounter: Payer: Self-pay | Admitting: Obstetrics & Gynecology

## 2018-08-04 VITALS — BP 113/72 | HR 72 | Wt 134.6 lb

## 2018-08-04 DIAGNOSIS — L299 Pruritus, unspecified: Secondary | ICD-10-CM | POA: Diagnosis not present

## 2018-08-04 DIAGNOSIS — O99712 Diseases of the skin and subcutaneous tissue complicating pregnancy, second trimester: Secondary | ICD-10-CM | POA: Diagnosis not present

## 2018-08-04 DIAGNOSIS — Z3480 Encounter for supervision of other normal pregnancy, unspecified trimester: Secondary | ICD-10-CM

## 2018-08-04 DIAGNOSIS — Z8719 Personal history of other diseases of the digestive system: Secondary | ICD-10-CM

## 2018-08-04 DIAGNOSIS — Z8759 Personal history of other complications of pregnancy, childbirth and the puerperium: Secondary | ICD-10-CM

## 2018-08-04 LAB — COMPREHENSIVE METABOLIC PANEL
ALBUMIN: 3.2 g/dL — AB (ref 3.5–5.5)
ALT: 12 IU/L (ref 0–32)
AST: 13 IU/L (ref 0–40)
Albumin/Globulin Ratio: 1.1 — ABNORMAL LOW (ref 1.2–2.2)
Alkaline Phosphatase: 81 IU/L (ref 39–117)
BUN / CREAT RATIO: 24 — AB (ref 9–23)
BUN: 14 mg/dL (ref 6–20)
Bilirubin Total: <0.2 mg/dL (ref 0.0–1.2)
CALCIUM: 8.6 mg/dL — AB (ref 8.7–10.2)
CO2: 20 mmol/L (ref 20–29)
CREATININE: 0.59 mg/dL (ref 0.57–1.00)
Chloride: 106 mmol/L (ref 96–106)
GFR calc non Af Amer: 131 mL/min/{1.73_m2} (ref >59)
GFR, EST AFRICAN AMERICAN: 150 mL/min/{1.73_m2} (ref >59)
Globulin, Total: 2.8 g/dL (ref 1.5–4.5)
Glucose: 94 mg/dL (ref 65–99)
Potassium: 4.1 mmol/L (ref 3.5–5.2)
Sodium: 140 mmol/L (ref 134–144)
TOTAL PROTEIN: 6 g/dL (ref 6.0–8.5)

## 2018-08-04 LAB — BILE ACIDS, TOTAL: Bile Acids Total: 6.1 umol/L (ref 0.0–10.0)

## 2018-08-04 MED ORDER — HYDROXYZINE HCL 25 MG PO TABS: 25.0000 mg | ORAL_TABLET | Freq: Four times a day (QID) | ORAL | 2 refills | Status: DC | PRN

## 2018-08-04 NOTE — Patient Instructions (Addendum)
Return to clinic for any scheduled appointments or obstetric concerns, or go to MAU for evaluation    Cholestasis of Pregnancy Cholestasis refers to any condition that causes the flow of digestive fluid (bile) produced by the liver to slow down or stop. Cholestasis of pregnancy is most common toward the end of pregnancy (thirdtrimester), but it can occur any time during pregnancy. The condition often goes away soon after giving birth. Cholestasis may be uncomfortable, but it is usually harmless to you. However, it can be harmful to your baby. Cholestasis may increase the risk of:  Your baby being born too early (preterm delivery).  Your baby having a slow heart rate and lack of oxygen during delivery (fetal distress).  Losing your baby before delivery (stillbirth).  What are the causes? The exact cause of this condition is not known, but it may be related to:  Pregnancy hormones. The gallbladder normally holds bile until you need it to help digest fat in your diet. Pregnancy hormones may cause the flow of bile to slow down and back up into your liver. Bile may then get into your bloodstream and cause cholestasis symptoms.  Changes in your genes (genetic mutations). Specifically, genes that affect how the liver releases bile.  What increases the risk? You are more likely to develop this condition if:  You had cholestasis during a previous pregnancy.  You have a family history of cholestasis.  You have liver problems.  You are having multiple babies, such as twins or triplets.  What are the signs or symptoms? The most common symptom of this condition is intense itching (pruritus), especially on the palms of your hands and soles of your feet. The itching can spread to the rest of your body and is often worse at night. You will not usually have a rash. Other symptoms may include:  Feeling tired.  Pain in your upper right abdomen.  Dark-colored urine.  Light-colored  stools.  Poor appetite.  Yellowish discoloration of your skin and the whites of your eyes (jaundice).  How is this diagnosed? This condition is diagnosed based on:  Your medical history.  A physical exam.  Blood tests.  If you have an inherited risk for developing this condition, you may also have genetic testing. How is this treated? The goal of treatment is to make you comfortable and keep your baby safe. Your health care provider may:  Prescribe medicine to reduce bile acid in your bloodstream, relieve symptoms, and help keep your baby safe.  Give you vitamin K before delivery to prevent excessive bleeding.  Check your baby frequently (fetal monitoring).  Perform regular blood tests to check your bile levels and liver function until your baby is delivered.  Recommend starting (inducing) your labor and delivery by week 36 or 37 of pregnancy, or as soon as your baby's lungs have developed enough.  Follow these instructions at home:  Take over-the-counter and prescription medicines only as told by your health care provider.  Take cool baths to soothe itchy skin.  Wear comfortable, loose-fitting, cotton clothing to reduce itching.  Keep your fingernails short to prevent skin irritation from scratching.  Keep all follow-up visits and prenatal visits as told by your health care provider. This is important. Contact a health care provider if:  Your symptoms get worse, even with treatment.  You develop pain in your right side.  You have unusual swelling in your abdomen, feet, ankles, or legs.  You have a fever.  You are more thirsty   than usual. Get help right away if:  You go into early labor.  You have a headache that does not go away or causes changes in vision.  You have nausea or you vomit.  You have severe pain in your abdomen or shoulders.  You have shortness of breath. Summary  Cholestasis of pregnancy is most common toward the end of pregnancy  (thirdtrimester), but it can occur any time during your pregnancy.  The condition often goes away soon after your baby is born.  The most common symptom of cholestasis of pregnancy is intense itching (pruritus), especially on the palms of your hands and soles of your feet.  This condition may be treated with medicine, frequent monitoring, or starting (inducing) labor and delivery by week 36 or 37 of pregnancy. This information is not intended to replace advice given to you by your health care provider. Make sure you discuss any questions you have with your health care provider. Document Released: 10/16/2000 Document Revised: 10/03/2016 Document Reviewed: 10/03/2016 Elsevier Interactive Patient Education  2017 Elsevier Inc.  

## 2018-08-04 NOTE — Progress Notes (Signed)
   PRENATAL VISIT NOTE  Subjective:  RAYMOND AZURE is a 22 y.o. G2P1001 at [redacted]w[redacted]d being seen today for ongoing prenatal care.  She is currently monitored for the following issues for this low-risk pregnancy and has Supervision of other normal pregnancy, antepartum; HSV-2 infection complicating pregnancy; Previous cesarean section complicating pregnancy; Round ligament pain; and Gonorrhea affecting pregnancy, antepartum, second trimester on their problem list.  Patient reports diffuse body itching for a few days. No lesions, no new contacts with anything.  History of cholestasis in previous pregnancy, she says this feels the same. .  Contractions: Not present.  .  Movement: Present. Denies leaking of fluid.   The following portions of the patient's history were reviewed and updated as appropriate: allergies, current medications, past family history, past medical history, past social history, past surgical history and problem list. Problem list updated.  Objective:   Vitals:   08/04/18 0906  BP: 113/72  Pulse: 72  Weight: 134 lb 9.6 oz (61.1 kg)    Fetal Status: Fetal Heart Rate (bpm): 150   Movement: Present     General:  Alert, oriented and cooperative. Patient is in no acute distress.  Skin: Skin is warm and dry. No rash noted.   Cardiovascular: Normal heart rate noted  Respiratory: Normal respiratory effort, no problems with respiration noted  Abdomen: Soft, gravid, appropriate for gestational age.  Pain/Pressure: Absent     Pelvic: Cervical exam deferred        Extremities: Normal range of motion.  Edema: None  Mental Status: Normal mood and affect. Normal behavior. Normal judgment and thought content.   Assessment and Plan:  Pregnancy: G2P1001 at [redacted]w[redacted]d  1. Pregnancy pruritus, second trimester 2. History of cholestasis during pregnancy Likely cholestasis again. Will check labs.  Will start Ursodiol, order scans etc once results are positive. For now, Atarax ordered.  -  hydrOXYzine (ATARAX/VISTARIL) 25 MG tablet; Take 1-2 tablets (25-50 mg total) by mouth every 6 (six) hours as needed for itching. (Patient not taking: Reported on 08/04/2018)  Dispense: 60 tablet; Refill: 2 - Comprehensive metabolic panel - Bile acids, total  3. Supervision of other normal pregnancy, antepartum Preterm labor symptoms and general obstetric precautions including but not limited to vaginal bleeding, contractions, leaking of fluid and fetal movement were reviewed in detail with the patient. Please refer to After Visit Summary for other counseling recommendations.  Return for OB visits as scheduled.  Future Appointments  Date Time Provider Department Center  08/16/2018  8:45 AM Reva Bores, MD [facility]    Jaynie Collins, MD

## 2018-08-08 ENCOUNTER — Encounter: Payer: Self-pay | Admitting: Radiology

## 2018-08-16 ENCOUNTER — Other Ambulatory Visit (HOSPITAL_COMMUNITY)
Admission: RE | Admit: 2018-08-16 | Discharge: 2018-08-16 | Disposition: A | Discharge status: Home / Self Care | Payer: Medicaid Other | Source: Ambulatory Visit | Attending: Family Medicine | Admitting: Family Medicine

## 2018-08-16 ENCOUNTER — Ambulatory Visit (INDEPENDENT_AMBULATORY_CARE_PROVIDER_SITE_OTHER): Payer: Medicaid Other | Admitting: Family Medicine

## 2018-08-16 ENCOUNTER — Encounter: Payer: Medicaid Other | Admitting: Family Medicine

## 2018-08-16 VITALS — BP 104/64 | HR 72 | Wt 134.5 lb

## 2018-08-16 DIAGNOSIS — O34219 Maternal care for unspecified type scar from previous cesarean delivery: Secondary | ICD-10-CM

## 2018-08-16 DIAGNOSIS — O98213 Gonorrhea complicating pregnancy, third trimester: Secondary | ICD-10-CM | POA: Diagnosis not present

## 2018-08-16 DIAGNOSIS — Z3480 Encounter for supervision of other normal pregnancy, unspecified trimester: Secondary | ICD-10-CM | POA: Diagnosis not present

## 2018-08-16 DIAGNOSIS — O98212 Gonorrhea complicating pregnancy, second trimester: Secondary | ICD-10-CM | POA: Diagnosis present

## 2018-08-16 DIAGNOSIS — O98512 Other viral diseases complicating pregnancy, second trimester: Secondary | ICD-10-CM

## 2018-08-16 DIAGNOSIS — Z3A28 28 weeks gestation of pregnancy: Secondary | ICD-10-CM | POA: Diagnosis not present

## 2018-08-16 DIAGNOSIS — B009 Herpesviral infection, unspecified: Secondary | ICD-10-CM

## 2018-08-16 MED ORDER — VALACYCLOVIR HCL 1 G PO TABS: 1000.0000 mg | ORAL_TABLET | Freq: Every day | ORAL | 2 refills | Status: DC

## 2018-08-16 NOTE — Progress Notes (Signed)
Would like to wait till next time for Flu and Tdap

## 2018-08-16 NOTE — Progress Notes (Signed)
    PRENATAL VISIT NOTE  Subjective:  Deanna Perez is a 22 y.o. G2P1001 at [redacted]w[redacted]d being seen today for ongoing prenatal care.  She is currently monitored for the following issues for this low-risk pregnancy and has Supervision of other normal pregnancy, antepartum; HSV-2 infection complicating pregnancy; Previous cesarean section complicating pregnancy; and Gonorrhea affecting pregnancy, antepartum, second trimester on their problem list.  Patient reports no complaints.  Contractions: Not present.  .  Movement: Present. Denies leaking of fluid.   The following portions of the patient's history were reviewed and updated as appropriate: allergies, current medications, past family history, past medical history, past social history, past surgical history and problem list. Problem list updated.  Objective:   Vitals:   08/16/18 0927  BP: 104/64  Pulse: 72  Weight: 134 lb 8 oz (61 kg)    Fetal Status: Fetal Heart Rate (bpm): 150 Fundal Height: 27 cm Movement: Present     General:  Alert, oriented and cooperative. Patient is in no acute distress.  Skin: Skin is warm and dry. No rash noted.   Cardiovascular: Normal heart rate noted  Respiratory: Normal respiratory effort, no problems with respiration noted  Abdomen: Soft, gravid, appropriate for gestational age.  Pain/Pressure: Absent     Pelvic: Cervical exam deferred        Extremities: Normal range of motion.  Edema: None  Mental Status: Normal mood and affect. Normal behavior. Normal judgment and thought content.   Assessment and Plan:  Pregnancy: G2P1001 at [redacted]w[redacted]d  1. Supervision of other normal pregnancy, antepartum 28 wk labs today - Glucose Tolerance, 2 Hours w/1 Hour; Future - RPR; Future - CBC; Future - HIV Antibody (routine testing w rflx) - CBC - RPR - Glucose Tolerance, 2 Hours w/1 Hour  2. Previous cesarean section complicating pregnancy TOLAC consent signed  3. Herpes simplex virus type 2 (HSV-2) infection  affecting pregnancy in second trimester Begin Valtrex for suppression.  - valACYclovir (VALTREX) 1000 MG tablet; Take 1 tablet (1,000 mg total) by mouth daily. Then begin 1/2 po bid  Dispense: 60 tablet; Refill: 2  4. Gonorrhea affecting pregnancy, antepartum, second trimester TOC today - Cervicovaginal ancillary only  Preterm labor symptoms and general obstetric precautions including but not limited to vaginal bleeding, contractions, leaking of fluid and fetal movement were reviewed in detail with the patient. Please refer to After Visit Summary for other counseling recommendations.  Return in 2 weeks (on 08/30/2018).  Future Appointments  Date Time Provider Department Center  08/30/2018  4:30 PM Reva Bores, MD [facility]    Reva Bores, MD

## 2018-08-16 NOTE — Patient Instructions (Signed)

## 2018-08-17 LAB — GLUCOSE TOLERANCE, 2 HOURS W/ 1HR
GLUCOSE, 2 HOUR: 78 mg/dL (ref 65–152)
Glucose, 1 hour: 106 mg/dL (ref 65–179)
Glucose, Fasting: 70 mg/dL (ref 65–91)

## 2018-08-17 LAB — CBC
HEMOGLOBIN: 9.8 g/dL — AB (ref 11.1–15.9)
Hematocrit: 29.9 % — ABNORMAL LOW (ref 34.0–46.6)
MCH: 28.4 pg (ref 26.6–33.0)
MCHC: 32.8 g/dL (ref 31.5–35.7)
MCV: 87 fL (ref 79–97)
Platelets: 183 10*3/uL (ref 150–450)
RBC: 3.45 x10E6/uL — AB (ref 3.77–5.28)
RDW: 14 % (ref 12.3–15.4)
WBC: 6.3 10*3/uL (ref 3.4–10.8)

## 2018-08-17 LAB — HIV ANTIBODY (ROUTINE TESTING W REFLEX): HIV Screen 4th Generation wRfx: NONREACTIVE

## 2018-08-17 LAB — CERVICOVAGINAL ANCILLARY ONLY
CHLAMYDIA, DNA PROBE: NEGATIVE
Neisseria Gonorrhea: NEGATIVE

## 2018-08-17 LAB — RPR: RPR Ser Ql: NONREACTIVE

## 2018-08-24 ENCOUNTER — Encounter: Payer: Self-pay | Admitting: *Deleted

## 2018-08-30 ENCOUNTER — Encounter: Payer: Self-pay | Admitting: *Deleted

## 2018-08-30 ENCOUNTER — Ambulatory Visit (INDEPENDENT_AMBULATORY_CARE_PROVIDER_SITE_OTHER): Payer: Medicaid Other | Admitting: Family Medicine

## 2018-08-30 VITALS — BP 122/57 | HR 70 | Wt 136.0 lb

## 2018-08-30 DIAGNOSIS — O98212 Gonorrhea complicating pregnancy, second trimester: Secondary | ICD-10-CM

## 2018-08-30 DIAGNOSIS — O98512 Other viral diseases complicating pregnancy, second trimester: Secondary | ICD-10-CM

## 2018-08-30 DIAGNOSIS — Z8719 Personal history of other diseases of the digestive system: Secondary | ICD-10-CM

## 2018-08-30 DIAGNOSIS — Z8759 Personal history of other complications of pregnancy, childbirth and the puerperium: Secondary | ICD-10-CM | POA: Diagnosis not present

## 2018-08-30 DIAGNOSIS — B009 Herpesviral infection, unspecified: Secondary | ICD-10-CM

## 2018-08-30 DIAGNOSIS — Z23 Encounter for immunization: Secondary | ICD-10-CM

## 2018-08-30 DIAGNOSIS — O34219 Maternal care for unspecified type scar from previous cesarean delivery: Secondary | ICD-10-CM

## 2018-08-30 DIAGNOSIS — Z3480 Encounter for supervision of other normal pregnancy, unspecified trimester: Secondary | ICD-10-CM

## 2018-08-30 NOTE — Progress Notes (Signed)
   PRENATAL VISIT NOTE  Subjective:  Deanna Perez is a 22 y.o. G2P1001 at [redacted]w[redacted]d being seen today for ongoing prenatal care.  She is currently monitored for the following issues for this low-risk pregnancy and has Supervision of other normal pregnancy, antepartum; HSV-2 infection complicating pregnancy; Previous cesarean section complicating pregnancy; and Gonorrhea affecting pregnancy, antepartum, second trimester on their problem list.  Patient reports no complaints.  Contractions: Not present.  .  Movement: Present. Denies leaking of fluid.   The following portions of the patient's history were reviewed and updated as appropriate: allergies, current medications, past family history, past medical history, past social history, past surgical history and problem list. Problem list updated.  Objective:   Vitals:   08/30/18 1638  BP: (!) 122/57  Pulse: 70  Weight: 136 lb (61.7 kg)    Fetal Status: Fetal Heart Rate (bpm): 147 Fundal Height: 28 cm Movement: Present     General:  Alert, oriented and cooperative. Patient is in no acute distress.  Skin: Skin is warm and dry. No rash noted.   Cardiovascular: Normal heart rate noted  Respiratory: Normal respiratory effort, no problems with respiration noted  Abdomen: Soft, gravid, appropriate for gestational age.  Pain/Pressure: Absent     Pelvic: Cervical exam deferred        Extremities: Normal range of motion.  Edema: None  Mental Status: Normal mood and affect. Normal behavior. Normal judgment and thought content.   Assessment and Plan:  Pregnancy: G2P1001 at [redacted]w[redacted]d  1. Supervision of other normal pregnancy, antepartum TDaP and flu today nml 28 wk labs  2. Previous cesarean section complicating pregnancy VBAC consent signed  3. 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 Gonorrhea affecting pregnancy, antepartum, second trimester Neg TOC last visit  5.  History of cholestasis during pregnancy Has itching. Nml levels 1 month ago--re-check - Bile acids, total  Preterm labor symptoms and general obstetric precautions including but not limited to vaginal bleeding, contractions, leaking of fluid and fetal movement were reviewed in detail with the patient. Please refer to After Visit Summary for other counseling recommendations.  Return in 2 weeks (on 09/13/2018).  No future appointments.  Reva Bores, MD

## 2018-08-30 NOTE — Patient Instructions (Signed)

## 2018-08-31 LAB — BILE ACIDS, TOTAL: Bile Acids Total: 6.3 umol/L (ref 0.0–10.0)

## 2018-09-01 ENCOUNTER — Encounter (HOSPITAL_COMMUNITY): Payer: Self-pay

## 2018-09-01 ENCOUNTER — Inpatient Hospital Stay (HOSPITAL_COMMUNITY)
Admission: AD | Admit: 2018-09-01 | Discharge: 2018-09-01 | Disposition: A | Discharge status: Home / Self Care | Payer: Medicaid Other | Source: Ambulatory Visit | Attending: Obstetrics & Gynecology | Admitting: Obstetrics & Gynecology

## 2018-09-01 DIAGNOSIS — O26893 Other specified pregnancy related conditions, third trimester: Secondary | ICD-10-CM | POA: Diagnosis not present

## 2018-09-01 DIAGNOSIS — R102 Pelvic and perineal pain: Secondary | ICD-10-CM | POA: Diagnosis not present

## 2018-09-01 LAB — WET PREP, GENITAL
Clue Cells Wet Prep HPF POC: NONE SEEN
Sperm: NONE SEEN
Trich, Wet Prep: NONE SEEN
Yeast Wet Prep HPF POC: NONE SEEN

## 2018-09-01 LAB — URINALYSIS, ROUTINE W REFLEX MICROSCOPIC
Bilirubin Urine: NEGATIVE
GLUCOSE, UA: NEGATIVE mg/dL
HGB URINE DIPSTICK: NEGATIVE
KETONES UR: NEGATIVE mg/dL
NITRITE: NEGATIVE
Protein, ur: NEGATIVE mg/dL
Specific Gravity, Urine: 1.02 (ref 1.005–1.030)
pH: 6 (ref 5.0–8.0)

## 2018-09-01 NOTE — Discharge Instructions (Signed)

## 2018-09-01 NOTE — MAU Note (Signed)
Pt reports cramping off and on since yesterday. Feeling ctx every few mins. Denies any vag bleeding or discharge. Good fetal movement reported.

## 2018-09-01 NOTE — MAU Provider Note (Addendum)
Chief Complaint: Abdominal Pain   First Provider Initiated Contact with Patient 09/01/18 1401     SUBJECTIVE HPI: Deanna Perez is a 22 y.o. G2P1001  who presents to Maternity Admissions reporting intermittent cramping. Two nights ago, her cramping started. It occurs every hour and lasts 30 minutes. The cramping starts in her lower abdomen and radiates to her lower back. She denies vaginal bleeding, discharge, and leakage of fluid. She has not had any dysuria, hematuria, or fever. She continues to have fetal movement. She has taken Tylenol for the cramping, which has relieved some of her symptoms. She rates her pain as an 8/10. Her first delivery was a scheduled C-section at [redacted]w[redacted]d due to HSV outbreak, so she has never felt contractions.   Past Medical History:  Diagnosis Date  . Cholestasis during pregnancy in third trimester 09/27/2017  . Herpes genitalis in women 11/03/2017   OB History  Gravida Para Term Preterm AB Living  2 1 1  0 0 1  SAB TAB Ectopic Multiple Live Births  0 0 0 0 1    # Outcome Date GA Lbr Len/2nd Weight Sex Delivery Anes PTL Lv  2 Current           1 Term 09/28/17 [redacted]w[redacted]d  2715 g F CS-LTranv Spinal  LIV   Past Surgical History:  Procedure Laterality Date  . CESAREAN SECTION N/A 09/28/2017   Procedure: CESAREAN SECTION;  Surgeon: Levie Heritage, DO;  Location: [facility];  Service: Obstetrics;  Laterality: N/A;   Social History   Socioeconomic History  . Marital status: Single    Spouse name: Not on file  . Number of children: Not on file  . Years of education: Not on file  . Highest education level: Not on file  Occupational History  . Not on file  Social Needs  . Financial resource strain: Not on file  . Food insecurity:    Worry: Not on file    Inability: Not on file  . Transportation needs:    Medical: Not on file    Non-medical: Not on file  Tobacco Use  . Smoking status: Never Smoker  . Smokeless tobacco: Never Used  Substance  and Sexual Activity  . Alcohol use: No    Comment: occasional  . Drug use: No  . Sexual activity: Yes  Lifestyle  . Physical activity:    Days per week: Not on file    Minutes per session: Not on file  . Stress: Not on file  Relationships  . Social connections:    Talks on phone: Not on file    Gets together: Not on file    Attends religious service: Not on file    Active member of club or organization: Not on file    Attends meetings of clubs or organizations: Not on file    Relationship status: Not on file  . Intimate partner violence:    Fear of current or ex partner: Not on file    Emotionally abused: Not on file    Physically abused: Not on file    Forced sexual activity: Not on file  Other Topics Concern  . Not on file  Social History Narrative  . Not on file   No current facility-administered medications on file prior to encounter.    Current Outpatient Medications on File Prior to Encounter  Medication Sig Dispense Refill  . acetaminophen (TYLENOL) 325 MG tablet Take 325 mg by mouth every 6 (six) hours as  needed for mild pain.    . Prenatal Vit-Fe Fumarate-FA (PRENATAL VITAMIN PO) Take 1 tablet by mouth daily.     . valACYclovir (VALTREX) 1000 MG tablet Take 1 tablet (1,000 mg total) by mouth daily. Then begin 1/2 po bid (Patient taking differently: Take 500 mg by mouth 2 (two) times daily. Then begin 1/2 po bid) 60 tablet 2  . Doxylamine-Pyridoxine ER (BONJESTA) 20-20 MG TBCR Take 1 tablet by mouth at bedtime. May add 1 tab midday if Sx persist (Patient not taking: Reported on 08/04/2018) 60 tablet 3  . Elastic Bandages & Supports (COMFORT FIT MATERNITY SUPP SM) MISC 1 Units by Does not apply route daily as needed. (Patient not taking: Reported on 08/04/2018) 1 each 0   No Known Allergies  I have reviewed the past Medical Hx, Surgical Hx, Social Hx, Allergies and Medications.   Review of Systems  Constitutional: Negative for chills and fever.  Respiratory: Negative  for shortness of breath.   Cardiovascular: Negative for chest pain and palpitations.  Gastrointestinal: Positive for abdominal pain (lower abdominal cramping). Negative for blood in stool, diarrhea, nausea and vomiting.  Genitourinary: Negative for dysuria and hematuria.  Musculoskeletal: Positive for back pain (lower).  Neurological: Negative for dizziness, weakness and headaches.    OBJECTIVE Patient Vitals for the past 24 hrs:  BP Pulse Height Weight  09/01/18 1332 (!) 101/56 89 5\' 1"  (1.549 m) 61.2 kg   Constitutional: Well-developed, well-nourished female in no acute distress.  Cardiovascular: normal rate Respiratory: normal rate and effort.  GI: Abd soft, non-tender, gravid appropriate for gestational age. Pos BS x 4 MS: Extremities nontender, no edema Neurologic: Alert and oriented x 4.  GU: Neg CVAT.  SPECULUM EXAM: white, watery discharge, NEFG, no blood noted Dilation: Fingertip(external) Cervical Position: Posterior Exam by:: Dr Aneta Mins   LAB RESULTS Results for orders placed or performed during the hospital encounter of 09/01/18 (from the past 24 hour(s))  Urinalysis, Routine w reflex microscopic     Status: Abnormal   Collection Time: 09/01/18  1:42 PM  Result Value Ref Range   Color, Urine YELLOW YELLOW   APPearance CLEAR CLEAR   Specific Gravity, Urine 1.020 1.005 - 1.030   pH 6.0 5.0 - 8.0   Glucose, UA NEGATIVE NEGATIVE mg/dL   Hgb urine dipstick NEGATIVE NEGATIVE   Bilirubin Urine NEGATIVE NEGATIVE   Ketones, ur NEGATIVE NEGATIVE mg/dL   Protein, ur NEGATIVE NEGATIVE mg/dL   Nitrite NEGATIVE NEGATIVE   Leukocytes, UA TRACE (A) NEGATIVE   RBC / HPF 0-5 0 - 5 RBC/hpf   WBC, UA 0-5 0 - 5 WBC/hpf   Bacteria, UA RARE (A) NONE SEEN   Squamous Epithelial / LPF 0-5 0 - 5   Mucus PRESENT   Wet prep, genital     Status: Abnormal   Collection Time: 09/01/18  2:53 PM  Result Value Ref Range   Yeast Wet Prep HPF POC NONE SEEN NONE SEEN   Trich, Wet Prep NONE  SEEN NONE SEEN   Clue Cells Wet Prep HPF POC NONE SEEN NONE SEEN   WBC, Wet Prep HPF POC FEW (A) NONE SEEN   Sperm NONE SEEN     IMAGING No results found.  MDM -Urinalysis completed. -Wet prep and Gon/Chl completed. -Tocometry  ASSESSMENT No diagnosis found.   1. Pelvic pain -Cervical exam shows external os 0.5cm opened, internal os closed. -No contractions on tocometer. -Wet prep negative. -Waiting on results from Gon/Chl swab. -Continue Tylenol and heating pad  for cramping and pain.  PLAN Discharge home in stable condition. Return if pain worsens or is associated with vaginal bleeding.      Roma Kayser, Student-PA 09/01/2018  3:18 PM   OB FELLOW MAU DISCHARGE ATTESTATION  I have seen and examined this patient; I agree with above documentation in the student's note. Ms. Dubuque has cramping pain with no palpable or recordable contractions. UA not suggestive of infection and wet prep negative. GC negative after positive earlier in pregnancy, but repeated today. Safe for discharge. Return precautions discussed. Follow up PRN or at next visit. All questions answered. Patient agrees to discharge.    Gwenevere Abbot, MD OB Fellow  09/01/2018, 3:52 PM

## 2018-09-02 LAB — GC/CHLAMYDIA PROBE AMP (~~LOC~~) NOT AT ARMC
CHLAMYDIA, DNA PROBE: NEGATIVE
Neisseria Gonorrhea: NEGATIVE

## 2018-09-07 ENCOUNTER — Encounter (HOSPITAL_COMMUNITY): Payer: Self-pay | Admitting: *Deleted

## 2018-09-07 ENCOUNTER — Inpatient Hospital Stay (HOSPITAL_COMMUNITY)
Admission: AD | Admit: 2018-09-07 | Discharge: 2018-09-07 | Disposition: A | Discharge status: Home / Self Care | Payer: Medicaid Other | Source: Ambulatory Visit | Attending: Obstetrics and Gynecology | Admitting: Obstetrics and Gynecology

## 2018-09-07 DIAGNOSIS — O99013 Anemia complicating pregnancy, third trimester: Secondary | ICD-10-CM

## 2018-09-07 DIAGNOSIS — R102 Pelvic and perineal pain: Secondary | ICD-10-CM | POA: Insufficient documentation

## 2018-09-07 DIAGNOSIS — O26893 Other specified pregnancy related conditions, third trimester: Secondary | ICD-10-CM | POA: Diagnosis not present

## 2018-09-07 DIAGNOSIS — R55 Syncope and collapse: Secondary | ICD-10-CM | POA: Diagnosis not present

## 2018-09-07 DIAGNOSIS — Z3A31 31 weeks gestation of pregnancy: Secondary | ICD-10-CM | POA: Diagnosis not present

## 2018-09-07 DIAGNOSIS — D649 Anemia, unspecified: Secondary | ICD-10-CM | POA: Insufficient documentation

## 2018-09-07 DIAGNOSIS — N949 Unspecified condition associated with female genital organs and menstrual cycle: Secondary | ICD-10-CM | POA: Diagnosis not present

## 2018-09-07 HISTORY — DX: Gonococcal infection, unspecified: A54.9

## 2018-09-07 LAB — URINALYSIS, ROUTINE W REFLEX MICROSCOPIC
Bilirubin Urine: NEGATIVE
Glucose, UA: NEGATIVE mg/dL
Hgb urine dipstick: NEGATIVE
Ketones, ur: NEGATIVE mg/dL
Nitrite: NEGATIVE
PROTEIN: NEGATIVE mg/dL
Specific Gravity, Urine: 1.018 (ref 1.005–1.030)
pH: 6 (ref 5.0–8.0)

## 2018-09-07 LAB — CBC
HEMATOCRIT: 30 % — AB (ref 36.0–46.0)
HEMOGLOBIN: 9.4 g/dL — AB (ref 12.0–15.0)
MCH: 26.6 pg (ref 26.0–34.0)
MCHC: 31.3 g/dL (ref 30.0–36.0)
MCV: 85 fL (ref 80.0–100.0)
Platelets: 198 10*3/uL (ref 150–400)
RBC: 3.53 MIL/uL — ABNORMAL LOW (ref 3.87–5.11)
RDW: 13.1 % (ref 11.5–15.5)
WBC: 8.5 10*3/uL (ref 4.0–10.5)
nRBC: 0 % (ref 0.0–0.2)

## 2018-09-07 LAB — COMPREHENSIVE METABOLIC PANEL
ALK PHOS: 104 U/L (ref 38–126)
ALT: 14 U/L (ref 0–44)
AST: 18 U/L (ref 15–41)
Albumin: 2.9 g/dL — ABNORMAL LOW (ref 3.5–5.0)
Anion gap: 7 (ref 5–15)
BUN: 10 mg/dL (ref 6–20)
CO2: 22 mmol/L (ref 22–32)
CREATININE: 0.66 mg/dL (ref 0.44–1.00)
Calcium: 8.4 mg/dL — ABNORMAL LOW (ref 8.9–10.3)
Chloride: 105 mmol/L (ref 98–111)
Glucose, Bld: 80 mg/dL (ref 70–99)
POTASSIUM: 3.7 mmol/L (ref 3.5–5.1)
Sodium: 134 mmol/L — ABNORMAL LOW (ref 135–145)
Total Bilirubin: 0.4 mg/dL (ref 0.3–1.2)
Total Protein: 6.8 g/dL (ref 6.5–8.1)

## 2018-09-07 LAB — GLUCOSE, CAPILLARY: GLUCOSE-CAPILLARY: 68 mg/dL — AB (ref 70–99)

## 2018-09-07 MED ORDER — IRON 325 (65 FE) MG PO TABS: 325.0000 mg | ORAL_TABLET | Freq: Every day | ORAL | 1 refills | Status: DC

## 2018-09-07 MED ORDER — ACETAMINOPHEN 500 MG PO TABS
1000.0000 mg | ORAL_TABLET | Freq: Once | ORAL | Status: AC
  Administered 2018-09-07: 1000 mg via ORAL
  Filled 2018-09-07: qty 2

## 2018-09-07 MED ORDER — COMFORT FIT MATERNITY SUPP MED MISC: 1.0000 | Freq: Every day | 0 refills | Status: DC | PRN

## 2018-09-07 NOTE — Discharge Instructions (Signed)
PREGNANCY SUPPORT BELT: You are not alone, Seventy-five percent of women have some sort of abdominal or back pain at some point in their pregnancy. Your baby is growing at a fast pace, which means that your whole body is rapidly trying to adjust to the changes. As your uterus grows, your back may start feeling a bit under stress and this can result in back or abdominal pain that can go from mild, and therefore bearable, to severe pains that will not allow you to sit or lay down comfortably, When it comes to dealing with pregnancy-related pains and cramps, some pregnant women usually prefer natural remedies, which the market is filled with nowadays. For example, wearing a pregnancy support belt can help ease and lessen your discomfort and pain. WHAT ARE THE BENEFITS OF WEARING A PREGNANCY SUPPORT BELT? A pregnancy support belt provides support to the lower portion of the belly taking some of the weight of the growing uterus and distributing to the other parts of your body. It is designed make you comfortable and gives you extra support. Over the years, the pregnancy apparel market has been studying the needs and wants of pregnant women and they have come up with the most comfortable pregnancy support belts that woman could ever ask for. In fact, you will no longer have to wear a stretched-out or bulky pregnancy belt that is visible underneath your clothes and makes you feel even more uncomfortable. Nowadays, a pregnancy support belt is made of comfortable and stretchy materials that will not irritate your skin but will actually make you feel at ease and you will not even notice you are wearing it. They are easy to put on and adjust during the day and can be worn at night for additional support.  BENEFITS:  Relives Back pain  Relieves Abdominal Muscle and Leg Pain  Stabilizes the Pelvic Ring  Offers a Cushioned Abdominal Lift Pad  Relieves pressure on the Sciatic Nerve Within Minutes WHERE TO GET  YOUR PREGNANCY BELT: [employer] [phone] [address]  [place],  [address]      Anemia Anemia is a condition in which you do not have enough red blood cells or hemoglobin. Hemoglobin is a substance in red blood cells that carries oxygen. When you do not have enough red blood cells or hemoglobin (are anemic), your body cannot get enough oxygen and your organs may not work properly. As a result, you may feel very tired or have other problems. What are the causes? Common causes of anemia include:  Excessive bleeding. Anemia can be caused by excessive bleeding inside or outside the body, including bleeding from the intestine or from periods in women.  Poor nutrition.  Long-lasting (chronic) kidney, thyroid, and liver disease.  Bone marrow disorders.  Cancer and treatments for cancer.  HIV (human immunodeficiency virus) and AIDS (acquired immunodeficiency syndrome).  Treatments for HIV and AIDS.  Spleen problems.  Blood disorders.  Infections, medicines, and autoimmune disorders that destroy red blood cells.  What are the signs or symptoms? Symptoms of this condition include:  Minor weakness.  Dizziness.  Headache.  Feeling heartbeats that are irregular or faster than normal (palpitations).  Shortness of breath, especially with exercise.  Paleness.  Cold sensitivity.  Indigestion.  Nausea.  Difficulty sleeping.  Difficulty concentrating.  Symptoms may occur suddenly or develop slowly. If your anemia is mild, you may not have symptoms. How is this diagnosed? This condition is diagnosed based on:  Blood tests.  Your medical history.  A physical exam.  Bone marrow biopsy.  Your health care provider may also check your stool (feces) for blood and may do additional testing to look for the cause of your bleeding. You may also have other tests, including:  Imaging tests, such as a CT scan or  MRI.  Endoscopy.  Colonoscopy.  How is this treated? Treatment for this condition depends on the cause. If you continue to lose a lot of blood, you may need to be treated at a hospital. Treatment may include:  Taking supplements of iron, vitamin D14, or folic acid.  Taking a hormone medicine (erythropoietin) that can help to stimulate red blood cell growth.  Having a blood transfusion. This may be needed if you lose a lot of blood.  Making changes to your diet.  Having surgery to remove your spleen.  Follow these instructions at home:  Take over-the-counter and prescription medicines only as told by your health care provider.  Take supplements only as told by your health care provider.  Follow any diet instructions that you were given.  Keep all follow-up visits as told by your health care provider. This is important. Contact a health care provider if:  You develop new bleeding anywhere in the body. Get help right away if:  You are very weak.  You are short of breath.  You have pain in your abdomen or chest.  You are dizzy or feel faint.  You have trouble concentrating.  You have bloody or black, tarry stools.  You vomit repeatedly or you vomit up blood. Summary  Anemia is a condition in which you do not have enough red blood cells or enough of a substance in your red blood cells that carries oxygen (hemoglobin).  Symptoms may occur suddenly or develop slowly.  If your anemia is mild, you may not have symptoms.  This condition is diagnosed with blood tests as well as a medical history and physical exam. Other tests may be needed.  Treatment for this condition depends on the cause of the anemia. This information is not intended to replace advice given to you by your health care provider. Make sure you discuss any questions you have with your health care provider. Document Released: 11/26/2004 Document Revised: 11/20/2016 Document Reviewed: 11/20/2016 Elsevier  Interactive Patient Education  [employer].

## 2018-09-07 NOTE — MAU Note (Addendum)
Pt arrived by EMS, pt states she was at work, had syncopal episode, lost consciousness, co worker caught her before the hit the floor.  Immediately after she started having lower abdominal cramping, intermittent.  Denies bleeding or LOF.  Reports fetal movement.  Pt states she had a piece of toast & some fruit for breakfast, hasn't had much to drink today.

## 2018-09-07 NOTE — MAU Provider Note (Signed)
History     CSN: [id number]  Arrival date and time: 09/07/18 1238   First Provider Initiated Contact with Patient 09/07/18 1346      Chief Complaint  Patient presents with  . Loss of Consciousness  . Abdominal Pain   HPI   Ms.Deanna Perez is a 22 y.o. female G75P1001 @ 59w2dwith a history of anemia, here in MAU after a syncopal episode that occurred today around 1200 noon. Says she was standing rolling silverware and became dizzy. She did not hit anything when she fell because someone caught her fall. She was sitting in a chair when 911 was called. For breakfast she ate toast with Jelly and some fruit this was around 0900. Has a history of syncope with previous pregnancy.  Occasional pain in her abdomen that is worse with movement and standing; this has been going on for a few weeks.  No abdominal currently. Says she has lower back pain now, however this has been going on.  She was here on 10/31 and says her cervix was checked and closed.   OB History    Gravida  2   Para  1   Term  1   Preterm  0   AB  0   Living  1     SAB  0   TAB  0   Ectopic  0   Multiple  0   Live Births  1           Past Medical History:  Diagnosis Date  . Cholestasis during pregnancy in third trimester 09/27/2017  . Gonorrhea   . Herpes genitalis in women 11/03/2017    Past Surgical History:  Procedure Laterality Date  . CESAREAN SECTION N/A 09/28/2017   Procedure: CESAREAN SECTION;  Surgeon: STruett Mainland DO;  [facility]  Service: Obstetrics;  Laterality: N/A;    Family History  Problem Relation Age of Onset  . Hypertension Mother   . Hypertension Father   . Heart disease Paternal Aunt   . Hypertension Paternal Grandmother   . Diabetes Paternal Grandmother   . Cancer Paternal Grandmother   . Hypertension Paternal Grandfather     Social History   Tobacco Use  . Smoking status: Never Smoker  . Smokeless tobacco: Never Used  Substance Use  Topics  . Alcohol use: No    Comment: occasional  . Drug use: No    Allergies: No Known Allergies  No medications prior to admission.   Results for orders placed or performed during the hospital encounter of 09/07/18 (from the past 48 hour(s))  Urinalysis, Routine w reflex microscopic     Status: Abnormal   Collection Time: 09/07/18  1:03 PM  Result Value Ref Range   Color, Urine YELLOW YELLOW   APPearance HAZY (A) CLEAR   Specific Gravity, Urine 1.018 1.005 - 1.030   pH 6.0 5.0 - 8.0   Glucose, UA NEGATIVE NEGATIVE mg/dL   Hgb urine dipstick NEGATIVE NEGATIVE   Bilirubin Urine NEGATIVE NEGATIVE   Ketones, ur NEGATIVE NEGATIVE mg/dL   Protein, ur NEGATIVE NEGATIVE mg/dL   Nitrite NEGATIVE NEGATIVE   Leukocytes, UA LARGE (A) NEGATIVE   RBC / HPF 0-5 0 - 5 RBC/hpf   WBC, UA 0-5 0 - 5 WBC/hpf   Bacteria, UA RARE (A) NONE SEEN   Squamous Epithelial / LPF 6-10 0 - 5   Mucus PRESENT     Comment: [facility]formed [facility] [address]  [place], [place] [address]  Glucose, capillary     Status: Abnormal   Collection Time: 09/07/18  1:07 PM  Result Value Ref Range   Glucose-Capillary 68 (L) 70 - 99 mg/dL  CBC     Status: Abnormal   Collection Time: 09/07/18  1:27 PM  Result Value Ref Range   WBC 8.5 4.0 - 10.5 K/uL   RBC 3.53 (L) 3.87 - 5.11 MIL/uL   Hemoglobin 9.4 (L) 12.0 - 15.0 g/dL   HCT 30.0 (L) 36.0 - 46.0 %   MCV 85.0 80.0 - 100.0 fL   MCH 26.6 26.0 - 34.0 pg   MCHC 31.3 30.0 - 36.0 g/dL   RDW 13.1 11.5 - 15.5 %   Platelets 198 150 - 400 K/uL   nRBC 0.0 0.0 - 0.2 %    Comment: Performed at [facility], [address] [place], [place] [address]  Comprehensive metabolic panel     Status: Abnormal   Collection Time: 09/07/18  1:27 PM  Result Value Ref Range   Sodium 134 (L) 135 - 145 mmol/L   Potassium 3.7 3.5 - 5.1 mmol/L   Chloride 105 98 - 111 mmol/L   CO2 22 22 - 32 mmol/L   Glucose, Bld 80 70 - 99 mg/dL   BUN 10 6 - 20 mg/dL   Creatinine, Ser  0.66 0.44 - 1.00 mg/dL   Calcium 8.4 (L) 8.9 - 10.3 mg/dL   Total Protein 6.8 6.5 - 8.1 g/dL   Albumin 2.9 (L) 3.5 - 5.0 g/dL   AST 18 15 - 41 U/L   ALT 14 0 - 44 U/L   Alkaline Phosphatase 104 38 - 126 U/L   Total Bilirubin 0.4 0.3 - 1.2 mg/dL   GFR calc non Af Amer >60 >60 mL/min   GFR calc Af Amer >60 >60 mL/min    Comment: (NOTE) The eGFR has been calculated using the CKD EPI equation. This calculation has not been validated in all clinical situations. eGFR's persistently <60 mL/min signify possible Chronic Kidney Disease.    Anion gap 7 5 - 15    Comment: Performed at [facility], [address] [place], [place] [address]    Review of Systems  Gastrointestinal: Negative for abdominal pain.  Musculoskeletal: Positive for back pain.   Physical Exam   Blood pressure 114/63, pulse 94, temperature 98 F (36.7 C), temperature source Oral, resp. rate 18, last menstrual period 01/31/2018, not currently breastfeeding.  Physical Exam  Constitutional: She appears well-developed and well-nourished. No distress.  HENT:  Head: Normocephalic.  Cardiovascular: Normal rate and regular rhythm.  Respiratory: Effort normal and breath sounds normal.  GI: Soft. She exhibits no distension. There is no tenderness. There is CVA tenderness. There is no rebound and no guarding.  Skin: Skin is warm. She is not diaphoretic.  Psychiatric: Her behavior is normal.   Fetal Tracing: Baseline: 130 bpm Variability: Moderate  Accelerations: 15x15 Decelerations: None Toco: UI  MAU Course  Procedures  None  MDM  EKG reviewed, NSR CMP Orthostatic vitals WNL  CBC with 9.4; discussed with patient.  Urine culture sent  CBG: 68> Patient given Juice and crackers.  1000 mg of tylenol given, patient improved. Cervical exam deferred due to patient fully dressed and states the tylenol helped her back pain.   Assessment and Plan   A:  1. Anemia in pregnancy, third trimester   2. Syncope,  unspecified syncope type   3. Round ligament pain     P:  Discharge home in stable condition Increase protein in your diet, especially in the Am with breakfast Rx: Iron Daily, pregnancy support belt  If symptoms persist may need to see Cardiology.  Return to MAU if symptoms worsen   [name], Artist Pais, NP 09/07/2018 5:45 PM

## 2018-09-08 LAB — CULTURE, OB URINE
CULTURE: NO GROWTH
SPECIAL REQUESTS: NORMAL

## 2018-09-13 ENCOUNTER — Ambulatory Visit (INDEPENDENT_AMBULATORY_CARE_PROVIDER_SITE_OTHER): Payer: Medicaid Other | Admitting: Family Medicine

## 2018-09-13 VITALS — BP 102/67 | HR 92 | Wt 138.0 lb

## 2018-09-13 DIAGNOSIS — Z3483 Encounter for supervision of other normal pregnancy, third trimester: Secondary | ICD-10-CM

## 2018-09-13 DIAGNOSIS — B009 Herpesviral infection, unspecified: Secondary | ICD-10-CM

## 2018-09-13 DIAGNOSIS — O34219 Maternal care for unspecified type scar from previous cesarean delivery: Secondary | ICD-10-CM

## 2018-09-13 DIAGNOSIS — O98513 Other viral diseases complicating pregnancy, third trimester: Secondary | ICD-10-CM

## 2018-09-13 DIAGNOSIS — Z3480 Encounter for supervision of other normal pregnancy, unspecified trimester: Secondary | ICD-10-CM

## 2018-09-13 NOTE — Patient Instructions (Signed)

## 2018-09-13 NOTE — Progress Notes (Signed)
   PRENATAL VISIT NOTE  Subjective:  Deanna Perez is a 22 y.o. G2P1001 at 9044w1d being seen today for ongoing prenatal care.  She is currently monitored for the following issues for this low-risk pregnancy and has Supervision of other normal pregnancy, antepartum; HSV-2 infection complicating pregnancy; Previous cesarean section complicating pregnancy; and Gonorrhea affecting pregnancy, antepartum, second trimester on their problem list.  Patient reports no complaints.  Contractions: Not present. Vag. Bleeding: None.  Movement: Present. Denies leaking of fluid.   The following portions of the patient's history were reviewed and updated as appropriate: allergies, current medications, past family history, past medical history, past social history, past surgical history and problem list. Problem list updated.  Objective:   Vitals:   09/13/18 1119  BP: 102/67  Pulse: 92  Weight: 138 lb (62.6 kg)    Fetal Status: Fetal Heart Rate (bpm): 142   Movement: Present     General:  Alert, oriented and cooperative. Patient is in no acute distress.  Skin: Skin is warm and dry. No rash noted.   Cardiovascular: Normal heart rate noted  Respiratory: Normal respiratory effort, no problems with respiration noted  Abdomen: Soft, gravid, appropriate for gestational age.  Pain/Pressure: Absent     Pelvic: Cervical exam deferred        Extremities: Normal range of motion.  Edema: Trace  Mental Status: Normal mood and affect. Normal behavior. Normal judgment and thought content.   Assessment and Plan:  Pregnancy: G2P1001 at 444w1d  1. Supervision of other normal pregnancy, antepartum Continue routine prenatal care.   2. Previous cesarean section complicating pregnancy Desires TOLAC  3. Herpes simplex virus type 2 (HSV-2) infection affecting pregnancy in third trimester Will need suppression at 35 wks.  Preterm labor symptoms and general obstetric precautions including but not limited to vaginal  bleeding, contractions, leaking of fluid and fetal movement were reviewed in detail with the patient. Please refer to After Visit Summary for other counseling recommendations.  Return in 2 weeks (on 09/27/2018).  Future Appointments  Date Time Provider Department Center  09/27/2018 11:30 AM Anyanwu, Jethro BastosUgonna A, MD [facility]    Reva Boresanya S Mekenzie Modeste, MD

## 2018-09-20 ENCOUNTER — Telehealth: Payer: Self-pay | Admitting: *Deleted

## 2018-09-20 NOTE — Telephone Encounter (Signed)
Pt called back, verbalizes and understands waiting till next week. Pt states she is mainly itching at night and asked what she could take for itching, told pt she could try 1% cortisone cream, and benadryl 25mg  po or the cream. Pt will call if it gets worse.

## 2018-09-20 NOTE — Telephone Encounter (Signed)
-----   Message from Lindell SparHeather L Bacon, [place] sent at 09/20/2018  9:28 AM EST ----- Regarding: wanting lab draw today  Contact: [phone] Patient called wanting to see if she could get lab drawn today, she has hx of cholestatis and has been itching.

## 2018-09-20 NOTE — Telephone Encounter (Signed)
Duplicate encounter

## 2018-09-20 NOTE — Telephone Encounter (Signed)
Left message with pt that I spoke with Dr Shawnie PonsPratt and she feels that the lab draw can wait till her appointment next week. If she had any other issues to call us back.   Scheryl Martenhristine Tereasa Yilmaz, RN

## 2018-09-27 ENCOUNTER — Encounter: Payer: Self-pay | Admitting: Obstetrics & Gynecology

## 2018-09-27 ENCOUNTER — Telehealth: Payer: Self-pay

## 2018-09-27 ENCOUNTER — Ambulatory Visit (INDEPENDENT_AMBULATORY_CARE_PROVIDER_SITE_OTHER): Payer: Medicaid Other | Admitting: Obstetrics & Gynecology

## 2018-09-27 VITALS — BP 103/70 | HR 85 | Wt 139.0 lb

## 2018-09-27 DIAGNOSIS — Z8759 Personal history of other complications of pregnancy, childbirth and the puerperium: Secondary | ICD-10-CM

## 2018-09-27 DIAGNOSIS — O98513 Other viral diseases complicating pregnancy, third trimester: Secondary | ICD-10-CM

## 2018-09-27 DIAGNOSIS — O34219 Maternal care for unspecified type scar from previous cesarean delivery: Secondary | ICD-10-CM

## 2018-09-27 DIAGNOSIS — B009 Herpesviral infection, unspecified: Secondary | ICD-10-CM

## 2018-09-27 DIAGNOSIS — L299 Pruritus, unspecified: Secondary | ICD-10-CM

## 2018-09-27 DIAGNOSIS — Z8719 Personal history of other diseases of the digestive system: Secondary | ICD-10-CM | POA: Diagnosis not present

## 2018-09-27 DIAGNOSIS — Z3480 Encounter for supervision of other normal pregnancy, unspecified trimester: Secondary | ICD-10-CM

## 2018-09-27 DIAGNOSIS — O99713 Diseases of the skin and subcutaneous tissue complicating pregnancy, third trimester: Secondary | ICD-10-CM | POA: Diagnosis not present

## 2018-09-27 LAB — COMPREHENSIVE METABOLIC PANEL
ALBUMIN: 3.6 g/dL (ref 3.5–5.5)
ALT: 13 IU/L (ref 0–32)
AST: 16 IU/L (ref 0–40)
Albumin/Globulin Ratio: 1.2 (ref 1.2–2.2)
Alkaline Phosphatase: 152 IU/L — ABNORMAL HIGH (ref 39–117)
BUN / CREAT RATIO: 10 (ref 9–23)
BUN: 8 mg/dL (ref 6–20)
Bilirubin Total: <0.2 mg/dL (ref 0.0–1.2)
CALCIUM: 8.8 mg/dL (ref 8.7–10.2)
CO2: 19 mmol/L — AB (ref 20–29)
CREATININE: 0.81 mg/dL (ref 0.57–1.00)
Chloride: 99 mmol/L (ref 96–106)
GFR, EST AFRICAN AMERICAN: 119 mL/min/{1.73_m2} (ref >59)
GFR, EST NON AFRICAN AMERICAN: 103 mL/min/{1.73_m2} (ref >59)
GLUCOSE: 67 mg/dL (ref 65–99)
Globulin, Total: 3.1 g/dL (ref 1.5–4.5)
Potassium: 4.2 mmol/L (ref 3.5–5.2)
Sodium: 133 mmol/L — ABNORMAL LOW (ref 134–144)
TOTAL PROTEIN: 6.7 g/dL (ref 6.0–8.5)

## 2018-09-27 LAB — BILE ACIDS, TOTAL: Bile Acids Total: 2.6 umol/L (ref 0.0–10.0)

## 2018-09-27 NOTE — Telephone Encounter (Signed)
Call patient to let her know labs were normal. No answer or voice mail to leave a message.

## 2018-09-27 NOTE — Progress Notes (Signed)
   PRENATAL VISIT NOTE  Subjective:  Deanna Perez is a 22 y.o. G2P1001 at 6770w1d being seen today for ongoing prenatal care.  She is currently monitored for the following issues for this low-risk pregnancy and has Supervision of other normal pregnancy, antepartum; HSV-2 infection complicating pregnancy; Previous cesarean section complicating pregnancy; and Gonorrhea affecting pregnancy, antepartum, second trimester on their problem list.  Patient reports continued itching on stomach, also has some itching on palms and soles.  Contractions: Irregular. Vag. Bleeding: None.  Movement: Present. Denies leaking of fluid.   The following portions of the patient's history were reviewed and updated as appropriate: allergies, current medications, past family history, past medical history, past social history, past surgical history and problem list. Problem list updated.  Objective:   Vitals:   09/27/18 1141  BP: 103/70  Pulse: 85  Weight: 139 lb (63 kg)    Fetal Status: Fetal Heart Rate (bpm): 151 Fundal Height: 34 cm Movement: Present     General:  Alert, oriented and cooperative. Patient is in no acute distress.  Skin: Skin is warm and dry. No rash noted.   Cardiovascular: Normal heart rate noted  Respiratory: Normal respiratory effort, no problems with respiration noted  Abdomen: Soft, gravid, appropriate for gestational age. +Stretch marks  Pain/Pressure: Absent     Pelvic: Cervical exam deferred        Extremities: Normal range of motion.  Edema: None  Mental Status: Normal mood and affect. Normal behavior. Normal judgment and thought content.   Assessment and Plan:  Pregnancy: G2P1001 at 2470w1d  1. Pruritus of pregnancy in third trimester 2. History of cholestasis during pregnancy Will recheck labs today. Recommended Sarna lotion for abdomen as part of the itching could be 2/2 stretching of abdomen.  - Comprehensive metabolic panel - Bile acids, total  3. Herpes simplex virus  type 2 (HSV-2) infection affecting pregnancy in third trimester Taking Valtrex suppression.  4. Previous cesarean section complicating pregnancy Already consented for TOLAC  5. Supervision of other normal pregnancy, antepartum Preterm labor symptoms and general obstetric precautions including but not limited to vaginal bleeding, contractions, leaking of fluid and fetal movement were reviewed in detail with the patient. Please refer to After Visit Summary for other counseling recommendations.  Return in about 2 weeks (around 10/11/2018) for Pelvic cultures, OB Visit.  Future Appointments  Date Time Provider Department Center  10/11/2018 10:00 AM Reva BoresPratt, Tanya S, MD [facility]  10/18/2018 10:15 AM Allie Bossierove, Myra C, MD [facility]    Jaynie CollinsUgonna Kjersti Dittmer, MD

## 2018-09-27 NOTE — Patient Instructions (Signed)
Contraception Choices Contraception, also called birth control, refers to methods or devices that prevent pregnancy. Hormonal methods Contraceptive implant A contraceptive implant is a thin, plastic tube that contains a hormone. It is inserted into the upper part of the arm. It can remain in place for up to 3 years. Progestin-only injections Progestin-only injections are injections of progestin, a synthetic form of the hormone progesterone. They are given every 3 months by a health care provider. Birth control pills Birth control pills are pills that contain hormones that prevent pregnancy. They must be taken once a day, preferably at the same time each day. Birth control patch The birth control patch contains hormones that prevent pregnancy. It is placed on the skin and must be changed once a week for three weeks and removed on the fourth week. A prescription is needed to use this method of contraception. Vaginal ring A vaginal ring contains hormones that prevent pregnancy. It is placed in the vagina for three weeks and removed on the fourth week. After that, the process is repeated with a new ring. A prescription is needed to use this method of contraception. Emergency contraceptive Emergency contraceptives prevent pregnancy after unprotected sex. They come in pill form and can be taken up to 5 days after sex. They work best the sooner they are taken after having sex. Most emergency contraceptives are available without a prescription. This method should not be used as your only form of birth control. Barrier methods Female condom A female condom is a thin sheath that is worn over the penis during sex. Condoms keep sperm from going inside a woman's body. They can be used with a spermicide to increase their effectiveness. They should be disposed after a single use. Female condom A female condom is a soft, loose-fitting sheath that is put into the vagina before sex. The condom keeps sperm from going  inside a woman's body. They should be disposed after a single use. Diaphragm A diaphragm is a soft, dome-shaped barrier. It is inserted into the vagina before sex, along with a spermicide. The diaphragm blocks sperm from entering the uterus, and the spermicide kills sperm. A diaphragm should be left in the vagina for 6-8 hours after sex and removed within 24 hours. A diaphragm is prescribed and fitted by a health care provider. A diaphragm should be replaced every 1-2 years, after giving birth, after gaining more than 15 lb (6.8 kg), and after pelvic surgery. Cervical cap A cervical cap is a round, soft latex or plastic cup that fits over the cervix. It is inserted into the vagina before sex, along with spermicide. It blocks sperm from entering the uterus. The cap should be left in place for 6-8 hours after sex and removed within 48 hours. A cervical cap must be prescribed and fitted by a health care provider. It should be replaced every 2 years. Sponge A sponge is a soft, circular piece of polyurethane foam with spermicide on it. The sponge helps block sperm from entering the uterus, and the spermicide kills sperm. To use it, you make it wet and then insert it into the vagina. It should be inserted before sex, left in for at least 6 hours after sex, and removed and thrown away within 30 hours. Spermicides Spermicides are chemicals that kill or block sperm from entering the cervix and uterus. They can come as a cream, jelly, suppository, foam, or tablet. A spermicide should be inserted into the vagina with an applicator at least 10-15 minutes before   sex to allow time for it to work. The process must be repeated every time you have sex. Spermicides do not require a prescription. Intrauterine contraception Intrauterine device (IUD) An IUD is a T-shaped device that is put in a woman's uterus. There are two types:  Hormone IUD.This type contains progestin, a synthetic form of the hormone progesterone. This  type can stay in place for 3-5 years.  Copper IUD.This type is wrapped in copper wire. It can stay in place for 10 years.  Permanent methods of contraception Female tubal ligation In this method, a woman's fallopian tubes are sealed, tied, or blocked during surgery to prevent eggs from traveling to the uterus. Hysteroscopic sterilization In this method, a small, flexible insert is placed into each fallopian tube. The inserts cause scar tissue to form in the fallopian tubes and block them, so sperm cannot reach an egg. The procedure takes about 3 months to be effective. Another form of birth control must be used during those 3 months. Female sterilization This is a procedure to tie off the tubes that carry sperm (vasectomy). After the procedure, the man can still ejaculate fluid (semen). Natural planning methods Natural family planning In this method, a couple does not have sex on days when the woman could become pregnant. Calendar method This means keeping track of the length of each menstrual cycle, identifying the days when pregnancy can happen, and not having sex on those days. Ovulation method In this method, a couple avoids sex during ovulation. Symptothermal method This method involves not having sex during ovulation. The woman typically checks for ovulation by watching changes in her temperature and in the consistency of cervical mucus. Post-ovulation method In this method, a couple waits to have sex until after ovulation. Summary  Contraception, also called birth control, means methods or devices that prevent pregnancy.  Hormonal methods of contraception include implants, injections, pills, patches, vaginal rings, and emergency contraceptives.  Barrier methods of contraception can include female condoms, female condoms, diaphragms, cervical caps, sponges, and spermicides.  There are two types of IUDs (intrauterine devices). An IUD can be put in a woman's uterus to prevent pregnancy  for 3-5 years.  Permanent sterilization can be done through a procedure for males, females, or both.  Natural family planning methods involve not having sex on days when the woman could become pregnant. This information is not intended to replace advice given to you by your health care provider. Make sure you discuss any questions you have with your health care provider. Document Released: 10/19/2005 Document Revised: 11/21/2016 Document Reviewed: 11/21/2016 Elsevier Interactive Patient Education  2018 [employer]  

## 2018-10-11 ENCOUNTER — Other Ambulatory Visit (HOSPITAL_COMMUNITY)
Admission: RE | Admit: 2018-10-11 | Discharge: 2018-10-11 | Disposition: A | Discharge status: Home / Self Care | Payer: Medicaid Other | Source: Ambulatory Visit | Attending: Family Medicine | Admitting: Family Medicine

## 2018-10-11 ENCOUNTER — Ambulatory Visit (INDEPENDENT_AMBULATORY_CARE_PROVIDER_SITE_OTHER): Payer: Medicaid Other | Admitting: Family Medicine

## 2018-10-11 VITALS — BP 99/67 | HR 72 | Wt 140.0 lb

## 2018-10-11 DIAGNOSIS — O34219 Maternal care for unspecified type scar from previous cesarean delivery: Secondary | ICD-10-CM

## 2018-10-11 DIAGNOSIS — Z3480 Encounter for supervision of other normal pregnancy, unspecified trimester: Secondary | ICD-10-CM

## 2018-10-11 DIAGNOSIS — B009 Herpesviral infection, unspecified: Secondary | ICD-10-CM

## 2018-10-11 DIAGNOSIS — O98513 Other viral diseases complicating pregnancy, third trimester: Secondary | ICD-10-CM

## 2018-10-11 DIAGNOSIS — Z3483 Encounter for supervision of other normal pregnancy, third trimester: Secondary | ICD-10-CM

## 2018-10-11 LAB — OB RESULTS CONSOLE GC/CHLAMYDIA: Gonorrhea: NEGATIVE

## 2018-10-11 LAB — OB RESULTS CONSOLE GBS: GBS: POSITIVE

## 2018-10-11 NOTE — Progress Notes (Signed)
   PRENATAL VISIT NOTE  Subjective:  Deanna Perez is a 22 y.o. G2P1001 at 3092w1d being seen today for ongoing prenatal care.  She is currently monitored for the following issues for this low-risk pregnancy and has Supervision of other normal pregnancy, antepartum; HSV-2 infection complicating pregnancy; Previous cesarean section complicating pregnancy; and Gonorrhea affecting pregnancy, antepartum, second trimester on their problem list.  Patient reports no complaints.  Contractions: Irregular.  .  Movement: Present. Denies leaking of fluid.   The following portions of the patient's history were reviewed and updated as appropriate: allergies, current medications, past family history, past medical history, past social history, past surgical history and problem list. Problem list updated.  Objective:   Vitals:   10/11/18 1024  BP: 99/67  Pulse: 72  Weight: 140 lb (63.5 kg)    Fetal Status: Fetal Heart Rate (bpm): 169 Fundal Height: 34 cm Movement: Present  Presentation: Vertex  General:  Alert, oriented and cooperative. Patient is in no acute distress.  Skin: Skin is warm and dry. No rash noted.   Cardiovascular: Normal heart rate noted  Respiratory: Normal respiratory effort, no problems with respiration noted  Abdomen: Soft, gravid, appropriate for gestational age.  Pain/Pressure: Absent     Pelvic: Cervical exam performed Dilation: Closed Effacement (%): 70 Station: -1  Extremities: Normal range of motion.  Edema: None  Mental Status: Normal mood and affect. Normal behavior. Normal judgment and thought content.   Assessment and Plan:  Pregnancy: G2P1001 at 5492w1d  1. Supervision of other normal pregnancy, antepartum Cultures today - Culture, beta strep (group b only) - GC[facility]Chlamydia probe amp [facility]  2. Previous cesarean section complicating pregnancy For TOLAC  3. Herpes simplex virus type 2 (HSV-2) infection affecting pregnancy in third  trimester Continue Valtrex--no s/sx's of outbreak today  Preterm labor symptoms and general obstetric precautions including but not limited to vaginal bleeding, contractions, leaking of fluid and fetal movement were reviewed in detail with the patient. Please refer to After Visit Summary for other counseling recommendations.  Return in 1 week (on 10/18/2018).  Future Appointments  Date Time Provider Department Center  10/18/2018 10:15 AM Allie Bossierove, Myra C, MD [facility]    Reva Boresanya S Reiko Vinje, MD

## 2018-10-11 NOTE — Patient Instructions (Signed)

## 2018-10-12 LAB — GC/CHLAMYDIA PROBE AMP (~~LOC~~) NOT AT ARMC
Chlamydia: NEGATIVE
NEISSERIA GONORRHEA: NEGATIVE

## 2018-10-14 ENCOUNTER — Encounter: Payer: Self-pay | Admitting: Family Medicine

## 2018-10-14 DIAGNOSIS — O99820 Streptococcus B carrier state complicating pregnancy: Secondary | ICD-10-CM | POA: Insufficient documentation

## 2018-10-14 LAB — CULTURE, BETA STREP (GROUP B ONLY): Strep Gp B Culture: POSITIVE — AB

## 2018-10-18 ENCOUNTER — Ambulatory Visit (INDEPENDENT_AMBULATORY_CARE_PROVIDER_SITE_OTHER): Payer: Medicaid Other | Admitting: Obstetrics & Gynecology

## 2018-10-18 VITALS — BP 104/71 | HR 61 | Wt 137.0 lb

## 2018-10-18 DIAGNOSIS — O99820 Streptococcus B carrier state complicating pregnancy: Secondary | ICD-10-CM

## 2018-10-18 DIAGNOSIS — B009 Herpesviral infection, unspecified: Secondary | ICD-10-CM

## 2018-10-18 DIAGNOSIS — O98513 Other viral diseases complicating pregnancy, third trimester: Secondary | ICD-10-CM

## 2018-10-18 DIAGNOSIS — O34219 Maternal care for unspecified type scar from previous cesarean delivery: Secondary | ICD-10-CM

## 2018-10-18 DIAGNOSIS — Z3480 Encounter for supervision of other normal pregnancy, unspecified trimester: Secondary | ICD-10-CM

## 2018-10-18 DIAGNOSIS — Z3483 Encounter for supervision of other normal pregnancy, third trimester: Secondary | ICD-10-CM

## 2018-10-18 NOTE — Progress Notes (Signed)
   PRENATAL VISIT NOTE  Subjective:  Deanna Perez is a 22 y.o. G2P1001 at 4758w1d being seen today for ongoing prenatal care.  She is currently monitored for the following issues for this low-risk pregnancy and has Supervision of other normal pregnancy, antepartum; HSV-2 infection complicating pregnancy; Previous cesarean section complicating pregnancy; Gonorrhea affecting pregnancy, antepartum, second trimester; and Group B Streptococcus carrier, +RV culture, currently pregnant on their problem list.  Patient reports no complaints.  Contractions: Irregular. Vag. Bleeding: None.  Movement: Present. Denies leaking of fluid.   The following portions of the patient's history were reviewed and updated as appropriate: allergies, current medications, past family history, past medical history, past social history, past surgical history and problem list. Problem list updated.  Objective:   Vitals:   10/18/18 1034  BP: 104/71  Pulse: 61  Weight: 137 lb (62.1 kg)    Fetal Status: Fetal Heart Rate (bpm): 141 Fundal Height: 34 cm Movement: Present  Presentation: Vertex  General:  Alert, oriented and cooperative. Patient is in no acute distress.  Skin: Skin is warm and dry. No rash noted.   Cardiovascular: Normal heart rate noted  Respiratory: Normal respiratory effort, no problems with respiration noted  Abdomen: Soft, gravid, appropriate for gestational age.  Pain/Pressure: Present     Pelvic: Cervical exam deferred        Extremities: Normal range of motion.  Edema: None  Mental Status: Normal mood and affect. Normal behavior. Normal judgment and thought content.   Assessment and Plan:  Pregnancy: G2P1001 at [redacted]w[redacted]d  1. Supervision of other normal pregnancy, antepartum   2. Herpes simplex virus type 2 (HSV-2) infection affecting pregnancy in third trimester - on valtrex  3. Previous cesarean section complicating pregnancy -for TOLAC  4. Group B Streptococcus carrier, +RV culture,  currently pregnant -treat in labor  Preterm labor symptoms and general obstetric precautions including but not limited to vaginal bleeding, contractions, leaking of fluid and fetal movement were reviewed in detail with the patient. Please refer to After Visit Summary for other counseling recommendations.  Return in about 1 week (around 10/25/2018).  No future appointments.  Allie BossierMyra C Holliday [name], MD

## 2018-10-24 ENCOUNTER — Ambulatory Visit (INDEPENDENT_AMBULATORY_CARE_PROVIDER_SITE_OTHER): Payer: Medicaid Other | Admitting: Obstetrics and Gynecology

## 2018-10-24 VITALS — BP 105/73 | HR 88 | Wt 144.0 lb

## 2018-10-24 DIAGNOSIS — O98512 Other viral diseases complicating pregnancy, second trimester: Secondary | ICD-10-CM

## 2018-10-24 DIAGNOSIS — B009 Herpesviral infection, unspecified: Secondary | ICD-10-CM

## 2018-10-24 DIAGNOSIS — O26843 Uterine size-date discrepancy, third trimester: Secondary | ICD-10-CM

## 2018-10-24 DIAGNOSIS — O98513 Other viral diseases complicating pregnancy, third trimester: Secondary | ICD-10-CM

## 2018-10-24 MED ORDER — VALACYCLOVIR HCL 1 G PO TABS: 1000.0000 mg | ORAL_TABLET | Freq: Every day | ORAL | 2 refills | Status: DC

## 2018-10-24 NOTE — Progress Notes (Signed)
Needs refill on Valtrex.

## 2018-10-24 NOTE — Progress Notes (Signed)
Prenatal Visit Note Date: 10/24/2018 Clinic: [facility]  Subjective:  Deanna Perez is a 22 y.o. G2P1001 at 6528w0d being seen today for ongoing prenatal care.  She is currently monitored for the following issues for this low-risk pregnancy and has Supervision of other normal pregnancy, antepartum; HSV-2 infection complicating pregnancy; Previous cesarean section complicating pregnancy; Gonorrhea affecting pregnancy, antepartum, second trimester; Group B Streptococcus carrier, +RV culture, currently pregnant; and Fundal height low for dates, third trimester on their problem list.  Patient reports no complaints.   Contractions: Not present. Vag. Bleeding: None.  Movement: Present. Denies leaking of fluid.   The following portions of the patient's history were reviewed and updated as appropriate: allergies, current medications, past family history, past medical history, past social history, past surgical history and problem list. Problem list updated.  Objective:   Vitals:   10/24/18 1626  BP: 105/73  Pulse: 88  Weight: 144 lb (65.3 kg)    Fetal Status: Fetal Heart Rate (bpm): 144 Fundal Height: 35 cm Movement: Present  Presentation: Vertex  General:  Alert, oriented and cooperative. Patient is in no acute distress.  Skin: Skin is warm and dry. No rash noted.   Cardiovascular: Normal heart rate noted  Respiratory: Normal respiratory effort, no problems with respiration noted  Abdomen: Soft, gravid, appropriate for gestational age. Pain/Pressure: Present     Pelvic:  Cervical exam deferred        Extremities: Normal range of motion.  Edema: None  Mental Status: Normal mood and affect. Normal behavior. Normal judgment and thought content.   Urinalysis:      Assessment and Plan:  Pregnancy: G2P1001 at 6228w0d  1. Herpes simplex virus type 2 (HSV-2) infection affecting pregnancy in second trimester Refill sent. Pt denies any prodromal  - valACYclovir (VALTREX)  1000 MG tablet; Take 1 tablet (1,000 mg total) by mouth daily.  Dispense: 60 tablet; Refill: 2  2. Fundal height low for dates, third trimester [facility] precautions given. Will set up for growth u/s - US MFM OB FOLLOW UP; Future  3. History of c-section Pt still wants to tolac  Preterm labor symptoms and general obstetric precautions including but not limited to vaginal bleeding, contractions, leaking of fluid and fetal movement were reviewed in detail with the patient. Please refer to After Visit Summary for other counseling recommendations.  Return in about 1 week (around 10/31/2018) for 7-10d rob.   Garfield BingPickens, Lisandra Mathisen, MD

## 2018-10-25 DIAGNOSIS — O26843 Uterine size-date discrepancy, third trimester: Secondary | ICD-10-CM | POA: Insufficient documentation

## 2018-10-31 ENCOUNTER — Ambulatory Visit (INDEPENDENT_AMBULATORY_CARE_PROVIDER_SITE_OTHER): Payer: Medicaid Other | Admitting: Obstetrics and Gynecology

## 2018-10-31 VITALS — BP 117/88 | HR 91 | Wt 143.0 lb

## 2018-10-31 DIAGNOSIS — O98513 Other viral diseases complicating pregnancy, third trimester: Secondary | ICD-10-CM

## 2018-10-31 DIAGNOSIS — O34219 Maternal care for unspecified type scar from previous cesarean delivery: Secondary | ICD-10-CM

## 2018-10-31 DIAGNOSIS — Z3483 Encounter for supervision of other normal pregnancy, third trimester: Secondary | ICD-10-CM

## 2018-10-31 DIAGNOSIS — O98519 Other viral diseases complicating pregnancy, unspecified trimester: Secondary | ICD-10-CM

## 2018-10-31 DIAGNOSIS — O99820 Streptococcus B carrier state complicating pregnancy: Secondary | ICD-10-CM

## 2018-10-31 DIAGNOSIS — B009 Herpesviral infection, unspecified: Secondary | ICD-10-CM

## 2018-10-31 DIAGNOSIS — O09893 Supervision of other high risk pregnancies, third trimester: Secondary | ICD-10-CM

## 2018-10-31 DIAGNOSIS — Z3480 Encounter for supervision of other normal pregnancy, unspecified trimester: Secondary | ICD-10-CM

## 2018-10-31 NOTE — Progress Notes (Signed)
Prenatal Visit Note Date: 10/31/2018 Clinic: [facility]  Subjective:  Deanna Perez is a 22 y.o. G2P1001 at 2870w0d being seen today for ongoing prenatal care.  She is currently monitored for the following issues for this low-risk pregnancy and has Supervision of other normal pregnancy, antepartum; HSV-2 infection complicating pregnancy; Previous cesarean section complicating pregnancy; Gonorrhea affecting pregnancy, antepartum, second trimester; Group B Streptococcus carrier, +RV culture, currently pregnant; Fundal height low for dates, third trimester; and Short interval between pregnancies affecting pregnancy in third trimester, antepartum on their problem list.  Patient reports no complaints.   Contractions: Irregular. Vag. Bleeding: None.  Movement: Present. Denies leaking of fluid.   The following portions of the patient's history were reviewed and updated as appropriate: allergies, current medications, past family history, past medical history, past social history, past surgical history and problem list. Problem list updated.  Objective:   Vitals:   10/31/18 1314  BP: 117/88  Pulse: 91  Weight: 143 lb (64.9 kg)    Fetal Status: Fetal Heart Rate (bpm): 135 Fundal Height: 36 cm Movement: Present  Presentation: Vertex  General:  Alert, oriented and cooperative. Patient is in no acute distress.  Skin: Skin is warm and dry. No rash noted.   Cardiovascular: Normal heart rate noted  Respiratory: Normal respiratory effort, no problems with respiration noted  Abdomen: Soft, gravid, appropriate for gestational age. Pain/Pressure: Present     Pelvic:  Cervical exam deferred        Extremities: Normal range of motion.  Edema: None  Mental Status: Normal mood and affect. Normal behavior. Normal judgment and thought content.   Urinalysis:      Assessment and Plan:  Pregnancy: G2P1001 at 6770w0d  1. Supervision of other normal pregnancy, antepartum Routine care.    2. Herpes simplex type 2 (HSV-2) infection affecting pregnancy, antepartum Continue ppx  3. Previous cesarean section complicating pregnancy Pt desires rpt c/s at 40/1 if doesn't go into spont labor. Request sent  4. Group B Streptococcus carrier, +RV culture, currently pregnant tx in labor  5. Size < dates U/s tomorrow  Preterm labor symptoms and general obstetric precautions including but not limited to vaginal bleeding, contractions, leaking of fluid and fetal movement were reviewed in detail with the patient. Please refer to After Visit Summary for other counseling recommendations.  Return in about 1 week (around 11/07/2018) for rob.   Gridley BingPickens, Leili Eskenazi, MD

## 2018-11-01 ENCOUNTER — Encounter (HOSPITAL_COMMUNITY): Payer: Self-pay

## 2018-11-01 ENCOUNTER — Ambulatory Visit (HOSPITAL_COMMUNITY)
Admission: RE | Admit: 2018-11-01 | Discharge: 2018-11-01 | Disposition: A | Discharge status: Home / Self Care | Payer: Medicaid Other | Source: Ambulatory Visit | Attending: Obstetrics and Gynecology | Admitting: Obstetrics and Gynecology

## 2018-11-01 DIAGNOSIS — O34219 Maternal care for unspecified type scar from previous cesarean delivery: Secondary | ICD-10-CM | POA: Diagnosis not present

## 2018-11-01 DIAGNOSIS — O09893 Supervision of other high risk pregnancies, third trimester: Secondary | ICD-10-CM

## 2018-11-01 DIAGNOSIS — Z3A39 39 weeks gestation of pregnancy: Secondary | ICD-10-CM | POA: Diagnosis not present

## 2018-11-01 DIAGNOSIS — O26843 Uterine size-date discrepancy, third trimester: Secondary | ICD-10-CM | POA: Diagnosis not present

## 2018-11-02 NOTE — L&D Delivery Note (Signed)
OB/[facility]N Faculty Practice Delivery Note  Deanna Perez is a 23 y.o. G2P1001 s/p VBAC at [redacted]w[redacted]d. She was admitted for spontaneous onset of labor.   ROM: 3h 48m with clear fluid, meconium-stained fluid noted at time of delivery  GBS Status: positive Maximum Maternal Temperature: Temp (24hrs), Avg:98.3 F (36.8 C), Min:97.5 F (36.4 C), Max:98.9 F (37.2 C)  Labor Progress: . Admitted at 4/90, transitioning into active labor  . Progressed to 6cm . Started pitocin with spacing out contractions . AROM clear fluid . Progressed to complete  Delivery Date/Time: 11/08/18 at 0048 Delivery: Called to room for prolonged deceleration. On arrival, patient was pushing and +1 station with pushing. HR improved with fetal scalp stim back to 140-150s. Stayed in room and pushed with patient. Head delivered ROA. No nuchal cord present - body cord noted and reduced at perinuem. Shoulder and body delivered in usual fashion. Infant with spontaneous cry, placed on mother's abdomen, dried and stimulated. Cord clamped x 2 after 1-minute delay, and cut by father of baby. Cord blood drawn. Placenta delivered spontaneously with gentle cord traction. Fundus firm with massage and Pitocin. Labia, perineum, vagina, and cervix inspected inspected with right labial laceration.   Placenta: spontaneous, intact, 3-vessel cord Complications: prolonged deceleration noted with pushing, meconium stained fluid noted with pushing, terminal meconium Lacerations: right labial repaired with 4-0 monocryl EBL: 472cc Analgesia: epidural   Postpartum Planning [x]  message to sent to schedule follow-up  [x]  vaccines UTD  Infant: vigorous female  APGARs 9 and 9  weight pending  Basilio Meadow S. Earlene Plater, DO OB/GYN Fellow, Faculty Practice

## 2018-11-03 ENCOUNTER — Encounter (HOSPITAL_COMMUNITY): Payer: Self-pay

## 2018-11-07 ENCOUNTER — Encounter (HOSPITAL_COMMUNITY)
Admission: RE | Admit: 2018-11-07 | Discharge: 2018-11-07 | Disposition: A | Discharge status: Home / Self Care | Payer: Medicaid Other | Source: Ambulatory Visit

## 2018-11-07 ENCOUNTER — Encounter: Payer: Medicaid Other | Admitting: Obstetrics and Gynecology

## 2018-11-07 ENCOUNTER — Inpatient Hospital Stay (HOSPITAL_COMMUNITY): Payer: Medicaid Other | Admitting: Anesthesiology

## 2018-11-07 ENCOUNTER — Inpatient Hospital Stay (HOSPITAL_COMMUNITY)
Admission: AD | Admit: 2018-11-07 | Discharge: 2018-11-09 | DRG: 807 | Disposition: A | Discharge status: Home / Self Care | Payer: Medicaid Other | Attending: Obstetrics and Gynecology | Admitting: Obstetrics and Gynecology

## 2018-11-07 ENCOUNTER — Other Ambulatory Visit: Payer: Self-pay

## 2018-11-07 ENCOUNTER — Encounter (HOSPITAL_COMMUNITY): Payer: Self-pay

## 2018-11-07 DIAGNOSIS — O99824 Streptococcus B carrier state complicating childbirth: Secondary | ICD-10-CM | POA: Diagnosis present

## 2018-11-07 DIAGNOSIS — A6000 Herpesviral infection of urogenital system, unspecified: Secondary | ICD-10-CM | POA: Diagnosis present

## 2018-11-07 DIAGNOSIS — O34219 Maternal care for unspecified type scar from previous cesarean delivery: Secondary | ICD-10-CM | POA: Diagnosis present

## 2018-11-07 DIAGNOSIS — O9902 Anemia complicating childbirth: Secondary | ICD-10-CM | POA: Diagnosis present

## 2018-11-07 DIAGNOSIS — Z98891 History of uterine scar from previous surgery: Secondary | ICD-10-CM | POA: Diagnosis present

## 2018-11-07 DIAGNOSIS — O09893 Supervision of other high risk pregnancies, third trimester: Secondary | ICD-10-CM

## 2018-11-07 DIAGNOSIS — Z3483 Encounter for supervision of other normal pregnancy, third trimester: Secondary | ICD-10-CM | POA: Diagnosis present

## 2018-11-07 DIAGNOSIS — D649 Anemia, unspecified: Secondary | ICD-10-CM | POA: Diagnosis present

## 2018-11-07 DIAGNOSIS — O98519 Other viral diseases complicating pregnancy, unspecified trimester: Secondary | ICD-10-CM

## 2018-11-07 DIAGNOSIS — O99820 Streptococcus B carrier state complicating pregnancy: Secondary | ICD-10-CM

## 2018-11-07 DIAGNOSIS — O98212 Gonorrhea complicating pregnancy, second trimester: Secondary | ICD-10-CM

## 2018-11-07 DIAGNOSIS — Z3A40 40 weeks gestation of pregnancy: Secondary | ICD-10-CM | POA: Diagnosis not present

## 2018-11-07 DIAGNOSIS — O9832 Other infections with a predominantly sexual mode of transmission complicating childbirth: Principal | ICD-10-CM | POA: Diagnosis present

## 2018-11-07 DIAGNOSIS — B009 Herpesviral infection, unspecified: Secondary | ICD-10-CM

## 2018-11-07 LAB — CBC
HCT: 33.1 % — ABNORMAL LOW (ref 36.0–46.0)
Hemoglobin: 10.3 g/dL — ABNORMAL LOW (ref 12.0–15.0)
MCH: 25.4 pg — ABNORMAL LOW (ref 26.0–34.0)
MCHC: 31.1 g/dL (ref 30.0–36.0)
MCV: 81.7 fL (ref 80.0–100.0)
Platelets: 222 10*3/uL (ref 150–400)
RBC: 4.05 MIL/uL (ref 3.87–5.11)
RDW: 15.3 % (ref 11.5–15.5)
WBC: 7.9 10*3/uL (ref 4.0–10.5)
nRBC: 0 % (ref 0.0–0.2)

## 2018-11-07 LAB — PROTEIN / CREATININE RATIO, URINE
Creatinine, Urine: 46 mg/dL
PROTEIN CREATININE RATIO: 0.24 mg/mg{creat} — AB (ref 0.00–0.15)
Total Protein, Urine: 11 mg/dL

## 2018-11-07 LAB — COMPREHENSIVE METABOLIC PANEL
ALT: 13 U/L (ref 0–44)
AST: 22 U/L (ref 15–41)
Albumin: 3.1 g/dL — ABNORMAL LOW (ref 3.5–5.0)
Alkaline Phosphatase: 262 U/L — ABNORMAL HIGH (ref 38–126)
Anion gap: 10 (ref 5–15)
BUN: 12 mg/dL (ref 6–20)
CO2: 18 mmol/L — ABNORMAL LOW (ref 22–32)
Calcium: 8.7 mg/dL — ABNORMAL LOW (ref 8.9–10.3)
Chloride: 104 mmol/L (ref 98–111)
Creatinine, Ser: 0.86 mg/dL (ref 0.44–1.00)
GFR calc Af Amer: >60 mL/min (ref >60)
GFR calc non Af Amer: >60 mL/min (ref >60)
GLUCOSE: 88 mg/dL (ref 70–99)
Potassium: 4.3 mmol/L (ref 3.5–5.1)
Sodium: 132 mmol/L — ABNORMAL LOW (ref 135–145)
Total Bilirubin: 0.5 mg/dL (ref 0.3–1.2)
Total Protein: 7.4 g/dL (ref 6.5–8.1)

## 2018-11-07 LAB — URINALYSIS, ROUTINE W REFLEX MICROSCOPIC
Bilirubin Urine: NEGATIVE
Glucose, UA: NEGATIVE mg/dL
Ketones, ur: NEGATIVE mg/dL
Nitrite: NEGATIVE
Protein, ur: NEGATIVE mg/dL
Specific Gravity, Urine: 1.005 (ref 1.005–1.030)
pH: 7 (ref 5.0–8.0)

## 2018-11-07 LAB — TYPE AND SCREEN
ABO/RH(D): A POS
Antibody Screen: NEGATIVE

## 2018-11-07 LAB — RPR: RPR Ser Ql: NONREACTIVE

## 2018-11-07 MED ORDER — FENTANYL 2.5 MCG/ML BUPIVACAINE 1/10 % EPIDURAL INFUSION (WH - ANES)
14.0000 mL/h | INTRAMUSCULAR | Status: DC | PRN
  Administered 2018-11-07 (×2): 14 mL/h via EPIDURAL
  Filled 2018-11-07 (×2): qty 100

## 2018-11-07 MED ORDER — FENTANYL CITRATE (PF) 100 MCG/2ML IJ SOLN
100.0000 ug | INTRAMUSCULAR | Status: DC | PRN
  Administered 2018-11-07 (×2): 100 ug via INTRAVENOUS
  Filled 2018-11-07 (×2): qty 2

## 2018-11-07 MED ORDER — LACTATED RINGERS IV SOLN
500.0000 mL | Freq: Once | INTRAVENOUS | Status: AC
  Administered 2018-11-07: 500 mL via INTRAVENOUS

## 2018-11-07 MED ORDER — TERBUTALINE SULFATE 1 MG/ML IJ SOLN
0.2500 mg | Freq: Once | INTRAMUSCULAR | Status: DC | PRN
  Filled 2018-11-07: qty 1

## 2018-11-07 MED ORDER — OXYTOCIN 40 UNITS IN LACTATED RINGERS INFUSION - SIMPLE MED: 1.0000 m[IU]/min | INTRAVENOUS | Status: DC

## 2018-11-07 MED ORDER — OXYTOCIN 40 UNITS IN LACTATED RINGERS INFUSION - SIMPLE MED: 2.5000 [IU]/h | INTRAVENOUS | Status: DC

## 2018-11-07 MED ORDER — LIDOCAINE HCL (PF) 1 % IJ SOLN
INTRAMUSCULAR | Status: DC | PRN
  Administered 2018-11-07 (×2): 4 mL via EPIDURAL

## 2018-11-07 MED ORDER — PHENYLEPHRINE 40 MCG/ML (10ML) SYRINGE FOR IV PUSH (FOR BLOOD PRESSURE SUPPORT)
80.0000 ug | PREFILLED_SYRINGE | INTRAVENOUS | Status: DC | PRN
  Filled 2018-11-07: qty 10

## 2018-11-07 MED ORDER — FLEET ENEMA 7-19 GM/118ML RE ENEM: 1.0000 | ENEMA | RECTAL | Status: DC | PRN

## 2018-11-07 MED ORDER — PHENYLEPHRINE 40 MCG/ML (10ML) SYRINGE FOR IV PUSH (FOR BLOOD PRESSURE SUPPORT)
80.0000 ug | PREFILLED_SYRINGE | INTRAVENOUS | Status: DC | PRN
  Filled 2018-11-07 (×2): qty 10

## 2018-11-07 MED ORDER — LIDOCAINE HCL (PF) 1 % IJ SOLN
30.0000 mL | INTRAMUSCULAR | Status: DC | PRN
  Filled 2018-11-07: qty 30

## 2018-11-07 MED ORDER — DIPHENHYDRAMINE HCL 50 MG/ML IJ SOLN
12.5000 mg | INTRAMUSCULAR | Status: DC | PRN
  Administered 2018-11-07 (×2): 12.5 mg via INTRAVENOUS
  Filled 2018-11-07: qty 1

## 2018-11-07 MED ORDER — SOD CITRATE-CITRIC ACID 500-334 MG/5ML PO SOLN: 30.0000 mL | ORAL | Status: DC | PRN

## 2018-11-07 MED ORDER — OXYTOCIN 40 UNITS IN LACTATED RINGERS INFUSION - SIMPLE MED
1.0000 m[IU]/min | INTRAVENOUS | Status: DC
  Administered 2018-11-07: 1 m[IU]/min via INTRAVENOUS
  Filled 2018-11-07: qty 1000

## 2018-11-07 MED ORDER — SODIUM CHLORIDE 0.9 % IV SOLN
5.0000 10*6.[IU] | Freq: Once | INTRAVENOUS | Status: AC
  Administered 2018-11-07: 5 10*6.[IU] via INTRAVENOUS
  Filled 2018-11-07: qty 5

## 2018-11-07 MED ORDER — ONDANSETRON HCL 4 MG/2ML IJ SOLN
4.0000 mg | Freq: Four times a day (QID) | INTRAMUSCULAR | Status: DC | PRN
  Administered 2018-11-07: 4 mg via INTRAVENOUS
  Filled 2018-11-07: qty 2

## 2018-11-07 MED ORDER — EPHEDRINE 5 MG/ML INJ
10.0000 mg | INTRAVENOUS | Status: DC | PRN
  Filled 2018-11-07: qty 2

## 2018-11-07 MED ORDER — LACTATED RINGERS IV SOLN
500.0000 mL | INTRAVENOUS | Status: DC | PRN
  Administered 2018-11-07: 500 mL via INTRAVENOUS

## 2018-11-07 MED ORDER — VALACYCLOVIR HCL 500 MG PO TABS
1000.0000 mg | ORAL_TABLET | Freq: Every day | ORAL | Status: DC
  Administered 2018-11-07: 1000 mg via ORAL
  Filled 2018-11-07 (×2): qty 2

## 2018-11-07 MED ORDER — LACTATED RINGERS IV SOLN
INTRAVENOUS | Status: DC
  Administered 2018-11-07 (×2): via INTRAVENOUS

## 2018-11-07 MED ORDER — OXYTOCIN BOLUS FROM INFUSION
500.0000 mL | Freq: Once | INTRAVENOUS | Status: AC
  Administered 2018-11-08: 500 mL via INTRAVENOUS

## 2018-11-07 MED ORDER — LACTATED RINGERS IV BOLUS
1000.0000 mL | Freq: Once | INTRAVENOUS | Status: AC
  Administered 2018-11-07: 1000 mL via INTRAVENOUS

## 2018-11-07 MED ORDER — PENICILLIN G 3 MILLION UNITS IVPB - SIMPLE MED
3.0000 10*6.[IU] | INTRAVENOUS | Status: DC
  Administered 2018-11-07 – 2018-11-08 (×4): 3 10*6.[IU] via INTRAVENOUS
  Filled 2018-11-07 (×7): qty 100

## 2018-11-07 NOTE — Anesthesia Pain Management Evaluation Note (Signed)
  [facility] Pain Management Visit Note  Patient: Shon Millet, 23 y.o., female  "Hello I am a member of the anesthesia team at [facility]. We have an anesthesia team available at all times to provide care throughout the hospital, including epidural management and anesthesia for C-section. I don't know your plan for the delivery whether it a natural birth, water birth, IV sedation, nitrous supplementation, doula or epidural, but we want to meet your pain goals."   1.Was your pain managed to your expectations on prior hospitalizations?   Yes   2.What is your expectation for pain management during this hospitalization?     Epidural  3.How can we help you reach that goal? Epidural placed at 1301.  Record the patient's initial score and the patient's pain goal.   Pain: 2  Pain Goal: 2 [facility] wants you to be able to say your pain was always managed very well.  Delmus Warwick 11/07/2018

## 2018-11-07 NOTE — Progress Notes (Signed)
Discussed plan of care with RN on L&D. Fetal deceleration present after AROM, recovered but now less variability and occasional small variables. Went into room and patient sleeping. Repeat SVE and now 8-9cm but only -1 station, reassuring fetal scalp stim. Continue pitocin. Anticipate VBAC. Comfortable with epidural, will plan to recheck in 1-2 hours or with changes in fetal status, maternal pressure.   Cristal Deer. Earlene Plater, DO OB/GYN Fellow

## 2018-11-07 NOTE — Progress Notes (Addendum)
Deanna Perez is a [age] y.o. G2P1001 at 7515w0d for SOL  Subjective: No acute distress. Epidural controlling pain.  Objective: BP 123/78   Pulse 95   Temp 98 F (36.7 C) (Oral)   Resp 16   Ht 5\' 1"  (1.549 m)   Wt 65.3 kg   LMP 01/31/2018   SpO2 99%   BMI 27.21 kg/m  No intake/output data recorded. Total I/O In: -  Out: 600 [Urine:600]  FHT:  FHR: 135 bpm, variability: moderate,  accelerations:  Present,  decelerations:  Absent UC:   regular, every 2-3 minutes SVE:   Dilation: 7.5 Effacement (%): 80 Station: Plus 1 Exam by:: Primitivo GauzeFletcher, MD   Labs: Lab Results  Component Value Date   WBC 7.9 11/07/2018   HGB 10.3 (L) 11/07/2018   HCT 33.1 (L) 11/07/2018   MCV 81.7 11/07/2018   PLT 222 11/07/2018    Assessment / Plan: SOL. Progressing on Pitocin, AROM @ 2100.  Labor: progressing on pitocin, s/p AROM Preeclampsia:  N/A Fetal Wellbeing:  Category I Pain Control:  Epidural I/D:  n/a Anticipated MOD:  NSVD  Myrene BuddyJacob Randy Whitener 11/07/2018, 9:18 PM

## 2018-11-07 NOTE — Progress Notes (Addendum)
LABOR PROGRESS NOTE  Deanna Perez is a 23 y.o. G2P1001 at [redacted]w[redacted]d  admitted for 10 days onset of labor.  Subjective: Patient is seen resting in bed.  She has no current complaints.  Objective: BP 119/72   Pulse 94   Temp 98.7 F (37.1 C) (Oral)   Resp 18   LMP 01/31/2018   SpO2 99%  or  Vitals:   11/07/18 1725 11/07/18 1730 11/07/18 1800 11/07/18 1830  BP: (!) 104/57 112/87 125/76 119/72  Pulse:  72 90 94  Resp:   18 18  Temp:      TempSrc:      SpO2:       Dilation: 6 Effacement (%): 100 Cervical Position: Middle Station: Plus 1 Presentation: Vertex Exam by:: [facility] RNC FHT: baseline rate 130, moderate varibility, 15 x 15 acel,-decel Toco: Pitocin 3 x3  Labs: Lab Results  Component Value Date   WBC 7.9 11/07/2018   HGB 10.3 (L) 11/07/2018   HCT 33.1 (L) 11/07/2018   MCV 81.7 11/07/2018   PLT 222 11/07/2018    Patient Active Problem List   Diagnosis Date Noted  . Indication for care in labor or delivery 11/07/2018  . Short interval between pregnancies affecting pregnancy in third trimester, antepartum 10/31/2018  . Fundal height low for dates, third trimester 10/25/2018  . Group B Streptococcus carrier, +RV culture, currently pregnant 10/14/2018  . Gonorrhea affecting pregnancy, antepartum, second trimester 07/14/2018  . Supervision of other normal pregnancy, antepartum 04/27/2018  . 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 Previous cesarean section complicating pregnancy 04/27/2018    Assessment / Plan: 23 y.o. G2P1001 at [redacted]w[redacted]d here for spontaneous onset of labor.  Labor: Minimal change since last check. Start pitocin 1x1 to help obtain adequate contractions Fetal Wellbeing: Category 1 Pain Control: Epidural Anticipated MOD: Vaginal  Peggyann Shoals, DO [facility], PGY-1 11/07/2018 6:49 PM   OB FELLOW LABOR PROGRESS NOTE ATTESTATION  I have seen and examined this patient and agree with above documentation in  the resident's note.   Gwenevere Abbot, MD  OB Fellow  11/07/2018, 8:39 PM

## 2018-11-07 NOTE — MAU Note (Signed)
Pt with cxts starting approx 2 hours ago. Pain 10/10. No vaginal bldg or watery leakage. Good FM.   Adah Perl RN

## 2018-11-07 NOTE — H&P (Signed)
OBSTETRIC ADMISSION HISTORY AND PHYSICAL  Deanna Perez is a 23 y.o. female G2P1001 with IUP at [redacted]w[redacted]d by L/18 presenting for spontaneous onset of labor. Contractions started overnight.   Reports fetal movement. Denies vaginal bleeding, leakage of fluids.  [facility]  Support person in labor: Mother  Ultrasounds . 18w1: normal fetal anatomy U/S, posterior placenta . 39w1: EFW 87%, 3719g  Prenatal History/Complications: . History of Cesarean section - HSV . HSV - no recent outbreaks, currently on prophylaxis   Past Medical History: Past Medical History:  Diagnosis Date  . Cholestasis during pregnancy in third trimester 09/27/2017  . Gonorrhea   . Herpes genitalis in women 11/03/2017    Past Surgical History: Past Surgical History:  Procedure Laterality Date  . CESAREAN SECTION N/A 09/28/2017   Procedure: CESAREAN SECTION;  Surgeon: Levie HeritageStinson, Jacob J, DO;  [facility];  Service: Obstetrics;  Laterality: N/A;    Obstetrical History: OB History    Gravida  2   Para  1   Term  1   Preterm  0   AB  0   Living  1     SAB  0   TAB  0   Ectopic  0   Multiple  0   Live Births  1           Social History: Social History   Socioeconomic History  . Marital status: Single    Spouse name: Not on file  . Number of children: Not on file  . Years of education: Not on file  . Highest education level: Not on file  Occupational History  . Not on file  Social Needs  . Financial resource strain: Not hard at all  . Food insecurity:    Worry: Never true    Inability: Never true  . Transportation needs:    Medical: No    Non-medical: Not on file  Tobacco Use  . Smoking status: Never Smoker  . Smokeless tobacco: Never Used  Substance and Sexual Activity  . Alcohol use: No    Comment: occasional  . Drug use: No  . Sexual activity: Yes  Lifestyle  . Physical activity:    Days per week: Not on file     Minutes per session: Not on file  . Stress: Only a little  Relationships  . Social connections:    Talks on phone: Not on file    Gets together: Not on file    Attends religious service: Not on file    Active member of club or organization: Not on file    Attends meetings of clubs or organizations: Not on file    Relationship status: Not on file  Other Topics Concern  . Not on file  Social History Narrative  . Not on file    Family History: Family History  Problem Relation Age of Onset  . Hypertension Mother   . Hypertension Father   . Heart disease Paternal Aunt   . Hypertension Paternal Grandmother   . Diabetes Paternal Grandmother   . Cancer Paternal Grandmother   . Hypertension Paternal Grandfather     Allergies: No Known Allergies  Medications Prior to Admission  Medication Sig Dispense Refill Last Dose  . acetaminophen (TYLENOL) 325 MG tablet Take 325 mg by mouth every 6 (six) hours as needed for mild pain or headache.    Taking  . Elastic Bandages & Supports (COMFORT FIT MATERNITY SUPP MED) MISC  1 each by Does not apply route daily as needed. (Patient not taking: Reported on 11/01/2018) 1 each 0 Not Taking at Unknown time  . Ferrous Sulfate (IRON) 325 (65 Fe) MG TABS Take 1 tablet (325 mg total) by mouth daily before breakfast. (Patient not taking: Reported on 09/13/2018) 30 each 1 Not Taking at Unknown time  . Prenatal Vit-Fe Fumarate-FA (PRENATAL VITAMIN PO) Take 1 tablet by mouth once a week.    Taking  . valACYclovir (VALTREX) 1000 MG tablet Take 1 tablet (1,000 mg total) by mouth daily. 60 tablet 2 11/01/2018 at Unknown time     Review of Systems  All systems reviewed and negative except as stated in HPI  Blood pressure 115/61, pulse 87, last menstrual period 01/31/2018, SpO2 99 %, not currently breastfeeding. General appearance: alert, crunching ice, shivering, uncomfortable during contractions Lungs: no respiratory distress Heart: regular rate  Abdomen:  soft, non-tender; gravid Pelvic: no active lesions identified by RN on exam ns s Extremities: no lower extremity edema  Presentation: cephalic by RN check Fetal monitoring: 130s/mod/+a/-d Uterine activity: irregular, every 2-5 minutes Dilation: 4 Effacement (%): 90 Station: -1 Exam by:: Roxan Hockey RN   Prenatal labs: ABO, Rh: A/Positive/-- (06/27 1320) Antibody: Negative (06/27 1320) Rubella: 4.25 (06/27 1320) RPR: Non Reactive (10/15 0914)  HBsAg: Negative (06/27 1320)  HIV: Non Reactive (10/15 0914)  GBS:   positive  Glucola: normal Genetic screening:  Low risk NIPS  Prenatal Transfer Tool  Maternal Diabetes: No Genetic Screening: Normal Maternal Ultrasounds/Referrals: Normal Fetal Ultrasounds or other Referrals:  None Maternal Substance Abuse:  No Significant Maternal Medications:  None Significant Maternal Lab Results: None  Results for orders placed or performed during the hospital encounter of 11/07/18 (from the past 24 hour(s))  Urinalysis, Routine w reflex microscopic   Collection Time: 11/07/18  6:55 AM  Result Value Ref Range   Color, Urine STRAW (A) YELLOW   APPearance CLEAR CLEAR   Specific Gravity, Urine 1.005 1.005 - 1.030   pH 7.0 5.0 - 8.0   Glucose, UA NEGATIVE NEGATIVE mg/dL   Hgb urine dipstick SMALL (A) NEGATIVE   Bilirubin Urine NEGATIVE NEGATIVE   Ketones, ur NEGATIVE NEGATIVE mg/dL   Protein, ur NEGATIVE NEGATIVE mg/dL   Nitrite NEGATIVE NEGATIVE   Leukocytes, UA LARGE (A) NEGATIVE   RBC / HPF 0-5 0 - 5 RBC/hpf   WBC, UA 11-20 0 - 5 WBC/hpf   Bacteria, UA RARE (A) NONE SEEN   Squamous Epithelial / LPF 0-5 0 - 5   Mucus PRESENT     Patient Active Problem List   Diagnosis Date Noted  . Short interval between pregnancies affecting pregnancy in third trimester, antepartum 10/31/2018  . Fundal height low for dates, third trimester 10/25/2018  . Group B Streptococcus carrier, +RV culture, currently pregnant 10/14/2018  . Gonorrhea affecting  pregnancy, antepartum, second trimester 07/14/2018  . Supervision of other normal pregnancy, antepartum 04/27/2018  . 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 Previous cesarean section complicating pregnancy 04/27/2018    Assessment/Plan:  Deanna Perez is a 23 y.o. G2P1001 at [redacted]w[redacted]d here for spontaneous onset of labor.   Labor: Spontaneous onset of labor. TOLAC consent signed during prenatal care, verified verbally today.  -- pain control: desires epidural  Fetal Wellbeing: EFW 7lbs by Leopold's. Cephalic by prior check.  -- GBS (positive) - pcn -- continuous fetal monitoring - category I - though periods of low-moderate variability   Postpartum Planning -- breast/nexplanon -- RI/[x] Tdap/[x] flu  Lambert Mody. Juleen China, DO OB/GYN Fellow

## 2018-11-07 NOTE — Anesthesia Preprocedure Evaluation (Signed)
Anesthesia Evaluation  Patient identified by MRN, date of birth, ID band Patient awake    Reviewed: Allergy & Precautions, NPO status , Patient's Chart, lab work & pertinent test results  History of Anesthesia Complications Negative for: history of anesthetic complications  Airway Mallampati: II  TM Distance: >3 FB Neck ROM: Full    Dental  (+) Dental Advisory Given   Pulmonary neg pulmonary ROS,    Pulmonary exam normal breath sounds clear to auscultation       Cardiovascular negative cardio ROS Normal cardiovascular exam Rhythm:Regular Rate:Normal     Neuro/Psych negative neurological ROS  negative psych ROS   GI/Hepatic negative GI ROS, Neg liver ROS,   Endo/Other  negative endocrine ROS  Renal/GU negative Renal ROS  negative genitourinary   Musculoskeletal negative musculoskeletal ROS (+)   Abdominal   Peds  Hematology  (+) anemia ,   Anesthesia Other Findings HSV  Reproductive/Obstetrics (+) Pregnancy                             Lab Results  Component Value Date   WBC 7.9 11/07/2018   HGB 10.3 (L) 11/07/2018   HCT 33.1 (L) 11/07/2018   MCV 81.7 11/07/2018   PLT 222 11/07/2018     Anesthesia Physical  Anesthesia Plan  ASA: II  Anesthesia Plan: Epidural   Post-op Pain Management:    Induction:   PONV Risk Score and Plan: Treatment may vary due to age or medical condition  Airway Management Planned: Natural Airway and Nasal Cannula  Additional Equipment: None  Intra-op Plan:   Post-operative Plan:   Informed Consent: I have reviewed the patients History and Physical, chart, labs and discussed the procedure including the risks, benefits and alternatives for the proposed anesthesia with the patient or authorized representative who has indicated his/her understanding and acceptance.   Dental advisory given  Plan Discussed with:   Anesthesia Plan Comments:          Anesthesia Quick Evaluation

## 2018-11-07 NOTE — Anesthesia Procedure Notes (Signed)
Epidural Patient location during procedure: OB Start time: 11/07/2018 12:48 PM End time: 11/07/2018 1:01 PM  Staffing Anesthesiologist: Heather RobertsSinger, Kaley Jutras, MD Performed: anesthesiologist   Preanesthetic Checklist Completed: patient identified, site marked, pre-op evaluation, timeout performed, IV checked, risks and benefits discussed and monitors and equipment checked  Epidural Patient position: sitting Prep: DuraPrep Patient monitoring: heart rate, cardiac monitor, continuous pulse ox and blood pressure Approach: midline Location: L2-L3 Injection technique: LOR saline  Needle:  Needle type: Tuohy  Needle gauge: 17 G Needle length: 9 cm Needle insertion depth: 5 cm Catheter size: 20 Guage Catheter at skin depth: 10 cm Test dose: negative and Other  Assessment Events: blood not aspirated, injection not painful, no injection resistance and negative IV test  Additional Notes Informed consent obtained prior to proceeding including risk of failure, 1% risk of PDPH, risk of minor discomfort and bruising.  Discussed rare but serious complications including epidural abscess, permanent nerve injury, epidural hematoma.  Discussed alternatives to epidural analgesia and patient desires to proceed.  Timeout performed pre-procedure verifying patient name, procedure, and platelet count.  Patient tolerated procedure well.

## 2018-11-07 NOTE — Progress Notes (Addendum)
LABOR PROGRESS NOTE  Deanna Perez is a 23 y.o. G2P1001 at [redacted]w[redacted]d  admitted for SOL.  Subjective: Patient is resting in bed having contractions q8, with otherwise moderate pain at rest.   Objective: BP (!) 134/94   Pulse 89   Temp (!) 97.5 F (36.4 C) (Axillary)   Resp 18   LMP 01/31/2018   SpO2 100%  or  Vitals:   11/07/18 0733 11/07/18 0848 11/07/18 0949 11/07/18 0950  BP: 118/80 122/80 (!) 134/94   Pulse: 95 83 89   Resp: 18 18  18   Temp:      TempSrc:      SpO2:       Dilation: 3.5 Effacement (%): 80 Cervical Position: Posterior Station: -1 Presentation: Vertex Exam by:: Cher Nakai RNC FHT: baseline rate 125, moderate varibility, + acel, - decel Toco: Pitocin   Labs: Lab Results  Component Value Date   WBC 7.9 11/07/2018   HGB 10.3 (L) 11/07/2018   HCT 33.1 (L) 11/07/2018   MCV 81.7 11/07/2018   PLT 222 11/07/2018    Patient Active Problem List   Diagnosis Date Noted  . Indication for care in labor or delivery 11/07/2018  . Short interval between pregnancies affecting pregnancy in third trimester, antepartum 10/31/2018  . Fundal height low for dates, third trimester 10/25/2018  . Group B Streptococcus carrier, +RV culture, currently pregnant 10/14/2018  . Gonorrhea affecting pregnancy, antepartum, second trimester 07/14/2018  . Supervision of other normal pregnancy, antepartum 04/27/2018  . 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 Previous cesarean section complicating pregnancy 04/27/2018    Assessment / Plan: 23 y.o. G2P1001 at [redacted]w[redacted]d here for SOL TOLAC.  Labor: Latent. Discussed starting pitocin, but will wait for epidural.  Fetal Wellbeing:  Cat I Pain Control:  Fentanyl followed by epidural Anticipated MOD:  Vaginal  Peggyann Shoals, DO 11/07/2018 11:20 AM  OB FELLOW LABOR PROGRESS NOTE ATTESTATION  I have seen and examined this patient and agree with above documentation in the resident's note.   Gwenevere Abbot, MD  OB  Fellow  11/07/2018, 8:40 PM

## 2018-11-08 ENCOUNTER — Inpatient Hospital Stay (HOSPITAL_COMMUNITY)
Admission: RE | Admit: 2018-11-08 | Payer: Medicaid Other | Source: Home / Self Care | Admitting: Obstetrics & Gynecology

## 2018-11-08 ENCOUNTER — Encounter (HOSPITAL_COMMUNITY): Payer: Self-pay

## 2018-11-08 ENCOUNTER — Encounter (HOSPITAL_COMMUNITY): Admission: RE | Payer: Self-pay | Source: Home / Self Care

## 2018-11-08 DIAGNOSIS — O99824 Streptococcus B carrier state complicating childbirth: Secondary | ICD-10-CM

## 2018-11-08 DIAGNOSIS — Z3A40 40 weeks gestation of pregnancy: Secondary | ICD-10-CM

## 2018-11-08 SURGERY — Surgical Case
Anesthesia: Regional

## 2018-11-08 MED ORDER — IBUPROFEN 600 MG PO TABS
600.0000 mg | ORAL_TABLET | Freq: Four times a day (QID) | ORAL | Status: DC
  Administered 2018-11-08 – 2018-11-09 (×5): 600 mg via ORAL
  Filled 2018-11-08 (×5): qty 1

## 2018-11-08 MED ORDER — ONDANSETRON HCL 4 MG PO TABS: 4.0000 mg | ORAL_TABLET | ORAL | Status: DC | PRN

## 2018-11-08 MED ORDER — MEASLES, MUMPS & RUBELLA VAC IJ SOLR
0.5000 mL | Freq: Once | INTRAMUSCULAR | Status: DC
  Filled 2018-11-08: qty 0.5

## 2018-11-08 MED ORDER — WITCH HAZEL-GLYCERIN EX PADS: 1.0000 "application " | MEDICATED_PAD | CUTANEOUS | Status: DC | PRN

## 2018-11-08 MED ORDER — SIMETHICONE 80 MG PO CHEW: 80.0000 mg | CHEWABLE_TABLET | ORAL | Status: DC | PRN

## 2018-11-08 MED ORDER — COCONUT OIL OIL
1.0000 "application " | TOPICAL_OIL | Status: DC | PRN
  Filled 2018-11-08: qty 120

## 2018-11-08 MED ORDER — ONDANSETRON HCL 4 MG/2ML IJ SOLN: 4.0000 mg | INTRAMUSCULAR | Status: DC | PRN

## 2018-11-08 MED ORDER — SENNOSIDES-DOCUSATE SODIUM 8.6-50 MG PO TABS
2.0000 | ORAL_TABLET | ORAL | Status: DC
  Administered 2018-11-08: 2 via ORAL
  Filled 2018-11-08: qty 2

## 2018-11-08 MED ORDER — BENZOCAINE-MENTHOL 20-0.5 % EX AERO: 1.0000 "application " | INHALATION_SPRAY | CUTANEOUS | Status: DC | PRN

## 2018-11-08 MED ORDER — ZOLPIDEM TARTRATE 5 MG PO TABS: 5.0000 mg | ORAL_TABLET | Freq: Every evening | ORAL | Status: DC | PRN

## 2018-11-08 MED ORDER — DIPHENHYDRAMINE HCL 25 MG PO CAPS: 25.0000 mg | ORAL_CAPSULE | Freq: Four times a day (QID) | ORAL | Status: DC | PRN

## 2018-11-08 MED ORDER — DIBUCAINE 1 % RE OINT: 1.0000 "application " | TOPICAL_OINTMENT | RECTAL | Status: DC | PRN

## 2018-11-08 MED ORDER — PRENATAL MULTIVITAMIN CH
1.0000 | ORAL_TABLET | Freq: Every day | ORAL | Status: DC
  Administered 2018-11-08: 1 via ORAL
  Filled 2018-11-08: qty 1

## 2018-11-08 MED ORDER — ACETAMINOPHEN 325 MG PO TABS
650.0000 mg | ORAL_TABLET | ORAL | Status: DC | PRN
  Administered 2018-11-08 (×2): 650 mg via ORAL
  Filled 2018-11-08 (×2): qty 2

## 2018-11-08 MED ORDER — TETANUS-DIPHTH-ACELL PERTUSSIS 5-2.5-18.5 LF-MCG/0.5 IM SUSP: 0.5000 mL | Freq: Once | INTRAMUSCULAR | Status: DC

## 2018-11-08 NOTE — Lactation Note (Signed)
This note was copied from a baby's chart. Lactation Consultation Note  Patient Name: Deanna Perez [id number] Date: 11/08/2018  G2P2 vaginal delivery of baby Deanna Rothwell now 71 hours old.  Mom reports with her last baby she had lots of challenges.  Specifically really sore nipples at first.  They were cracked and bleeding.Got discouraged and Then switched to formula then tried to switch back to berastfeeding and she would not latch.  Mom reports total time breastfeeding was about 3-4 months. Moms first child just turned 45 year old in novemebr.  Mom reports she asked for formula because she wanted to make sure he was getting enough.  Reviewed adequate voids and stools , wt loss, and feedings with mom.  Discussed starting pumping or doing some hand expression and feeding him  breastmilk instead.  Mom reports not interested just going to feed him formula at this time.  Reviewed [facility] Consultation handouts, Yellow feeding logs, breastfeeding Resource list, And understanding mother and baby.  Urged mom to hand express before feeding and after feeding and rub expressed breastmilk on nipples/air dry.  Urged mom to call lactation as needed.,    Maternal Data    Feeding Feeding Type: Breast Fed  LATCH Score Latch: Grasps breast easily, tongue down, lips flanged, rhythmical sucking.  Audible Swallowing: Spontaneous and intermittent  Type of Nipple: Everted at rest and after stimulation  Comfort (Breast/Nipple): Soft / non-tender  Hold (Positioning): Assistance needed to correctly position infant at breast and maintain latch.  LATCH Score: 9  Interventions    Lactation Tools Discussed/Used     Consult Status      Nakeita Styles Michaelle Copas 11/08/2018, 5:47 PM

## 2018-11-08 NOTE — Anesthesia Postprocedure Evaluation (Signed)
Anesthesia Post Note  Patient: Deanna Perez  Procedure(s) Performed: AN AD HOC LABOR EPIDURAL     Patient location during evaluation: Mother Baby Anesthesia Type: Epidural Level of consciousness: awake and alert and oriented Pain management: satisfactory to patient Vital Signs Assessment: post-procedure vital signs reviewed and stable Respiratory status: spontaneous breathing and nonlabored ventilation Cardiovascular status: stable Postop Assessment: no headache, no backache, no signs of nausea or vomiting, adequate PO intake, patient able to bend at knees and able to ambulate (patient up walking) Anesthetic complications: no    Last Vitals:  Vitals:   11/08/18 0300 11/08/18 0400  BP: 114/81 122/84  Pulse: 88 87  Resp: 18 18  Temp: 37.2 C 36.8 C  SpO2: 99% 99%    Last Pain:  Vitals:   11/08/18 0400  TempSrc: Oral  PainSc: 0-No pain   Pain Goal:                 Pate Aylward

## 2018-11-09 MED ORDER — IBUPROFEN 600 MG PO TABS: 600.0000 mg | ORAL_TABLET | Freq: Four times a day (QID) | ORAL | 0 refills | Status: DC

## 2018-11-09 NOTE — Lactation Note (Signed)
This note was copied from a baby's chart. Lactation Consultation Note  Patient Name: Deanna Perez Jenkin ZOXWR'UToday's Date: 11/09/2018 Reason for consult: Follow-up assessment;Term;Infant weight loss(3% weight loss / unable to assess latch - recently was fed a bottle )  Reported to this LC mom changed to pump and bottle feed  LC into visit mom and reviewed feeding options/  Mom mentioned the reason she supplemented she wanted to make sure the baby was getting enough.  Also her breast are filling/ and heavier today/ and not liking the dryness on her nipples.  LC recommended EBM to her nipples liberally and after feeding coconut oil.( alittle dab will do).  LC explored options and mentioned to mom she may find latching is easier when milk comes in.  Option #1  breast massage / hand express/ pre-pump to prime the milk ducts / give appetizer of EBM or formula 5-10 ml / and than latch with firm support / offer both breast / if baby is satisfied hold off on supplement / if not feed 30 ml of EBM or formula.  Mom receptive to the above option. LC instructed her on the use of shells / and hand pump.  [facility] to provide coconut oil for dry nipples.  Sore nipple and engorgement prevention and tx.  LC recommended to mom if the above option poses challenges with latching to call WIC for a DEBP.  Mother informed of post-discharge support and given phone number to the lactation department, including services for phone call assistance; out-patient appointments; and breastfeeding support group. List of other breastfeeding resources in the community given in the handout. Encouraged mother to call for problems or concerns related to breastfeeding.   Maternal Data Has patient been taught Hand Expression?: Yes Does the patient have breastfeeding experience prior to this delivery?: Yes  Feeding Feeding Type: Formula  LATCH Score                   Interventions Interventions:  Breast feeding basics reviewed;Hand pump;Shells  Lactation Tools Discussed/Used Tools: Pump;Shells Shell Type: Inverted Breast pump type: Manual WIC Program: Yes Pump Review: Setup, frequency, and cleaning Initiated by:: MAI  Date initiated:: 11/09/18   Consult Status Consult Status: Complete Date: 11/09/18    Matilde SprangMargaret Ann Jacqlyn Marolf 11/09/2018, 11:13 AM

## 2018-11-09 NOTE — Discharge Summary (Addendum)
OB Discharge Summary     Patient Name: Deanna Perez DOB: 1996/01/19 MRN: [id number]  Date of admission: 11/07/2018 Delivering MD: Tamera Stands   Date of discharge: 11/09/2018  Admitting diagnosis: 40WKS CTX Intrauterine pregnancy: [redacted]w[redacted]d     Secondary diagnosis:  Active Problems:   HSV-2 infection complicating pregnancy   Previous cesarean section complicating pregnancy   Group B Streptococcus carrier, +RV culture, currently pregnant   Short interval between pregnancies affecting pregnancy in third trimester, antepartum   Indication for care in labor or delivery  Additional problems: none     Discharge diagnosis: Term Pregnancy Delivered                                                                                                Post partum procedures:none  Augmentation: AROM and Pitocin  Complications: None  Hospital course:  Onset of Labor With Vaginal Delivery     23 y.o. yo [id number] at [redacted]w[redacted]d was admitted in Active Labor on 11/07/2018. Patient had an uncomplicated labor course as follows: Patient admitted on 1/6 for SOL. She was 4cm dilation at time of admission. Patient was a TOLAC with signed consent in chart. She was started on pitocin shortly after admission, as well as AROMd. Patient started having small variable decels at 8-9cm. Responded well to fetal scalp stim. Around 2 hours later baby was delivered with the only abnormality being a body cord which was easily reduced.   Patient had a routine post-delivery course with no complications.  Membrane Rupture Time/Date: 9:04 PM ,11/07/2018   Intrapartum Procedures: Episiotomy: None [1]                                         Lacerations:  Labial [10]  Patient had a delivery of a Viable infant. 11/08/2018  Information for the patient's newborn:  Viveca, Raitz [[id number]]  Delivery Method: Vag-Spont    Pateint had an uncomplicated postpartum course.  She is ambulating, tolerating a regular diet, passing  flatus, and urinating well. Patient is discharged home in stable condition on 11/09/18.   Physical exam  Vitals:   11/08/18 1225 11/08/18 1611 11/08/18 2118 11/09/18 0612  BP: 139/64 117/76 130/88 98/60  Pulse: 63 85 (!) 57 65  Resp: 18 18 (!) 22 18  Temp: 98.1 F (36.7 C) 97.6 F (36.4 C) 98 F (36.7 C) 97.9 F (36.6 C)  TempSrc: Oral Oral Oral Oral  SpO2:   98% 99%  Weight:      Height:       General: alert, cooperative and no distress Lochia: appropriate Uterine Fundus: firm Incision: N/A DVT Evaluation: No evidence of DVT seen on physical exam. Negative Homan's sign. No cords or calf tenderness. Labs: Lab Results  Component Value Date   WBC 7.9 11/07/2018   HGB 10.3 (L) 11/07/2018   HCT 33.1 (L) 11/07/2018   MCV 81.7 11/07/2018   PLT 222 11/07/2018   CMP Latest Ref Rng & Units 11/07/2018  Glucose 70 -  99 mg/dL 88  BUN 6 - 20 mg/dL 12  Creatinine 5.400.44 - 9.811.00 mg/dL 1.910.86  Sodium 478135 - 295145 mmol/L 132(L)  Potassium 3.5 - 5.1 mmol/L 4.3  Chloride 98 - 111 mmol/L 104  CO2 22 - 32 mmol/L 18(L)  Calcium 8.9 - 10.3 mg/dL 6.2(Z8.7(L)  Total Protein 6.5 - 8.1 g/dL 7.4  Total Bilirubin 0.3 - 1.2 mg/dL 0.5  Alkaline Phos 38 - 126 U/L 262(H)  AST 15 - 41 U/L 22  ALT 0 - 44 U/L 13    Discharge instruction: per After Visit Summary and "Baby and Me Booklet".  After visit meds:  Allergies as of 11/09/2018   No Known Allergies     Medication List    STOP taking these medications   acetaminophen 325 MG tablet Commonly known as:  TYLENOL     TAKE these medications   ibuprofen 600 MG tablet Commonly known as:  ADVIL,MOTRIN Take 1 tablet (600 mg total) by mouth every 6 (six) hours.   valACYclovir 1000 MG tablet Commonly known as:  VALTREX Take 1 tablet (1,000 mg total) by mouth daily.       Diet: routine diet  Activity: Advance as tolerated. Pelvic rest for 6 weeks.   Outpatient follow up:4 weeks Follow up Appt: Future Appointments  Date Time Provider Department  Center  12/06/2018  1:15 PM Reva BoresPratt, Tanya S, MD [facility]   Follow up Visit:No follow-ups on file.  Postpartum contraception: Nexplanon  Newborn Data: Live born female  Birth Weight: 7 lb 1.1 oz (3205 g) APGAR: 9, 9  Newborn Delivery   Birth date/time:  11/08/2018 00:48:00 Delivery type:  VBAC, Spontaneous     Baby Feeding: Breast Disposition:home with mother   11/09/2018 Myrene BuddyJacob Fletcher, MD  OB FELLOW DISCHARGE ATTESTATION  I have seen and examined this patient and agree with above documentation in the resident's note.   Gwenevere AbbotNimeka Jeffry Vogelsang, MD  OB Fellow  11/09/2018, 8:46 PM

## 2018-12-05 NOTE — Progress Notes (Signed)
Post Partum Exam  Deanna Perez is a 23 y.o. [id number] female who presents for a postpartum visit. She is 4 weeks postpartum following a vaginal delivery 11/09/2018. I have fully reviewed the prenatal and intrapartum course. The delivery was at 40 gestational weeks.  Anesthesia: epidural. Postpartum course has been uncomplicated. Baby's course has been uncomplicated. Baby is feeding by breast. Bleeding no bleeding. Bowel function is normal. Bladder function is normal. Patient is not sexually active. Contraception method is Nexplanon. Postpartum depression screening:neg I have independently reviewed the above information and concur.  The following portions of the patient's history were reviewed and updated as appropriate: allergies, current medications, past family history, past medical history, past social history, past surgical history and problem list. Last pap smear done 11/03/2017 and was Normal  Review of Systems Pertinent items noted in HPI and remainder of comprehensive ROS otherwise negative.    Objective:  Blood pressure 106/70, pulse 80, height 5' (1.524 m), weight 122 lb (55.3 kg), unknown if currently breastfeeding.  General:  alert, cooperative and appears stated age  Lungs: normal effort  Heart:  regular rate and rhythm  Abdomen: soft, non-tender        Assessment:    Normal postpartum exam. Pap smear not done at today's visit.   Plan:   1. Contraception: Nexplanon 2. Pap due 11/2020 3. Follow up in: 2 weeks for Nexplanon insertion or as needed.  4. Yearly Chlamydia testing.

## 2018-12-06 ENCOUNTER — Ambulatory Visit (INDEPENDENT_AMBULATORY_CARE_PROVIDER_SITE_OTHER): Payer: Medicaid Other | Admitting: Family Medicine

## 2018-12-06 ENCOUNTER — Encounter: Payer: Self-pay | Admitting: *Deleted

## 2018-12-06 DIAGNOSIS — Z1389 Encounter for screening for other disorder: Secondary | ICD-10-CM | POA: Diagnosis not present

## 2018-12-06 NOTE — Patient Instructions (Signed)
Etonogestrel implant  What is this medicine?  ETONOGESTREL (et oh noe JES trel) is a contraceptive (birth control) device. It is used to prevent pregnancy. It can be used for up to 3 years.  This medicine may be used for other purposes; ask your health care provider or pharmacist if you have questions.  COMMON BRAND NAME(S): Implanon, Nexplanon  What should I tell my health care provider before I take this medicine?  They need to know if you have any of these conditions:  -abnormal vaginal bleeding  -blood vessel disease or blood clots  -breast, cervical, endometrial, ovarian, liver, or uterine cancer  -diabetes  -gallbladder disease  -heart disease or recent heart attack  -high blood pressure  -high cholesterol or triglycerides  -kidney disease  -liver disease  -migraine headaches  -seizures  -stroke  -tobacco smoker  -an unusual or allergic reaction to etonogestrel, anesthetics or antiseptics, other medicines, foods, dyes, or preservatives  -pregnant or trying to get pregnant  -breast-feeding  How should I use this medicine?  This device is inserted just under the skin on the inner side of your upper arm by a health care professional.  Talk to your pediatrician regarding the use of this medicine in children. Special care may be needed.  Overdosage: If you think you have taken too much of this medicine contact a poison control center or emergency room at once.  NOTE: This medicine is only for you. Do not share this medicine with others.  What if I miss a dose?  This does not apply.  What may interact with this medicine?  Do not take this medicine with any of the following medications:  -amprenavir  -fosamprenavir  This medicine may also interact with the following medications:  -acitretin  -aprepitant  -armodafinil  -bexarotene  -bosentan  -carbamazepine  -certain medicines for fungal infections like fluconazole, ketoconazole, itraconazole and voriconazole  -certain medicines to treat hepatitis, HIV or  AIDS  -cyclosporine  -felbamate  -griseofulvin  -lamotrigine  -modafinil  -oxcarbazepine  -phenobarbital  -phenytoin  -primidone  -rifabutin  -rifampin  -rifapentine  -St. John's wort  -topiramate  This list may not describe all possible interactions. Give your health care provider a list of all the medicines, herbs, non-prescription drugs, or dietary supplements you use. Also tell them if you smoke, drink alcohol, or use illegal drugs. Some items may interact with your medicine.  What should I watch for while using this medicine?  This product does not protect you against HIV infection (AIDS) or other sexually transmitted diseases.  You should be able to feel the implant by pressing your fingertips over the skin where it was inserted. Contact your doctor if you cannot feel the implant, and use a non-hormonal birth control method (such as condoms) until your doctor confirms that the implant is in place. Contact your doctor if you think that the implant may have broken or become bent while in your arm.  You will receive a user card from your health care provider after the implant is inserted. The card is a record of the location of the implant in your upper arm and when it should be removed. Keep this card with your health records.  What side effects may I notice from receiving this medicine?  Side effects that you should report to your doctor or health care professional as soon as possible:  -allergic reactions like skin rash, itching or hives, swelling of the face, lips, or tongue  -breast lumps, breast tissue   changes, or discharge  -breathing problems  -changes in emotions or moods  -if you feel that the implant may have broken or bent while in your arm  -high blood pressure  -pain, irritation, swelling, or bruising at the insertion site  -scar at site of insertion  -signs of infection at the insertion site such as fever, and skin redness, pain or discharge  -signs and symptoms of a blood clot such as breathing  problems; changes in vision; chest pain; severe, sudden headache; pain, swelling, warmth in the leg; trouble speaking; sudden numbness or weakness of the face, arm or leg  -signs and symptoms of liver injury like dark yellow or brown urine; general ill feeling or flu-like symptoms; light-colored stools; loss of appetite; nausea; right upper belly pain; unusually weak or tired; yellowing of the eyes or skin  -unusual vaginal bleeding, discharge  Side effects that usually do not require medical attention (report to your doctor or health care professional if they continue or are bothersome):  -acne  -breast pain or tenderness  -headache  -irregular menstrual bleeding  -nausea  This list may not describe all possible side effects. Call your doctor for medical advice about side effects. You may report side effects to FDA at 1-800-FDA-1088.  Where should I keep my medicine?  This drug is given in a hospital or clinic and will not be stored at home.  NOTE: This sheet is a summary. It may not cover all possible information. If you have questions about this medicine, talk to your doctor, pharmacist, or health care provider.   2019 Elsevier/Gold Standard (2017-09-07 14:11:42)

## 2018-12-22 ENCOUNTER — Encounter: Payer: Self-pay | Admitting: Obstetrics and Gynecology

## 2018-12-22 ENCOUNTER — Encounter: Payer: Self-pay | Admitting: *Deleted

## 2018-12-22 ENCOUNTER — Ambulatory Visit (INDEPENDENT_AMBULATORY_CARE_PROVIDER_SITE_OTHER): Payer: Medicaid Other | Admitting: Obstetrics and Gynecology

## 2018-12-22 VITALS — BP 113/76 | HR 72 | Wt 122.0 lb

## 2018-12-22 DIAGNOSIS — Z30017 Encounter for initial prescription of implantable subdermal contraceptive: Secondary | ICD-10-CM

## 2018-12-22 MED ORDER — ETONOGESTREL 68 MG ~~LOC~~ IMPL
68.0000 mg | DRUG_IMPLANT | Freq: Once | SUBCUTANEOUS | Status: AC
  Administered 2018-12-22: 68 mg via SUBCUTANEOUS

## 2018-12-22 NOTE — Procedures (Signed)
Nexplanon Insertion Procedure Note Patient had prior one removed due to it expiring and she liked it. Last sex greater than two weeks ago. UPT today negative.   Prior to the procedure being performed, the patient (or guardian) was asked to state their full name, date of birth, type of procedure being performed and the exact location of the operative site. This information was then checked against the documentation in the patient's chart. Prior to the procedure being performed, a "time out" was performed by the physician that confirmed the correct patient, procedure and site.  After informed consent was obtained, the patient's non-dominant left arm was chosen for insertion. A site was seen approximately 8 cm proximal to the medial epicondyle in the sulcus between the biceps and triceps on the inner surface that was the old site. The area was cleaned with alcohol then local anesthesia was infiltrated with 3 ml of 1% lidocaine with epinephrine along the planned insertion track. The area was prepped with betadine. Using sterile technique the Nexplanon device was inserted per manufacturer's guidelines in the subdermal connective tissue using the standard insertion technique without difficulty. Pressure was applied and the insertion site was hemostatic. The presence of the Nexplanon was confirmed immediately after insertion by palpation by both me and the patient and by checking the tip of needle for the absence of the insert.  A pressure dressing was applied.  The patient tolerated the procedure well.  Cornelia Copa MD [facility]ending [facility])

## 2019-05-01 IMAGING — US US MFM OB COMP +14 WKS
1 series · 14 of 28 positions shown · non-contrast
Comparison: none

[Series 1: us mfm ob comp +14 wks · 100 acquisitions, 14 frames shown]
[im 4/100]
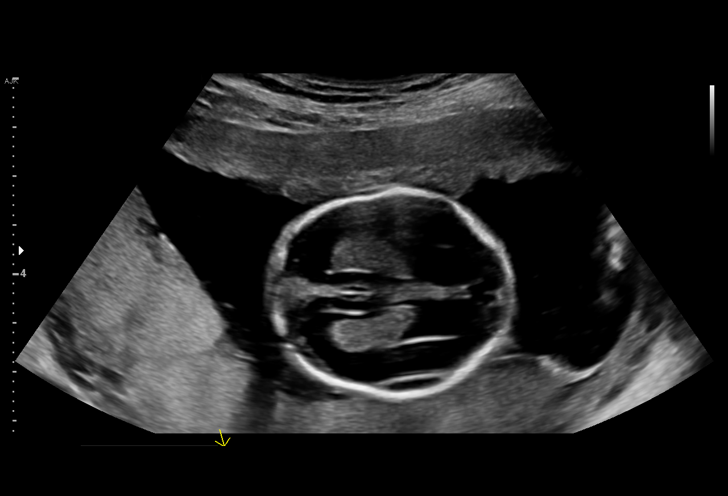
[im 12/100]
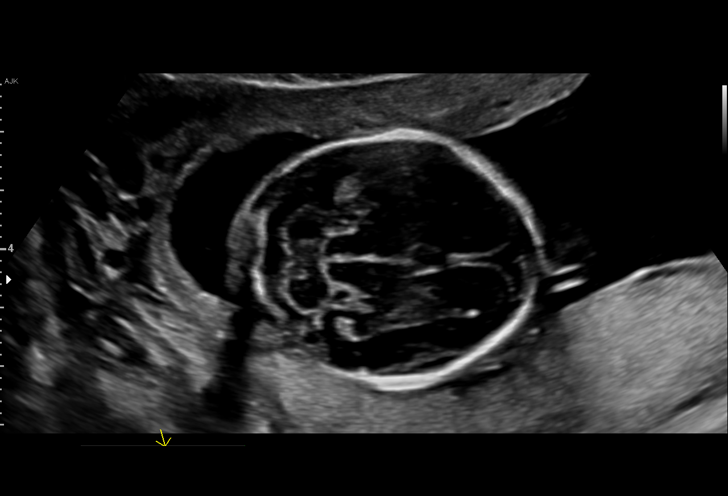
[im 19/100]
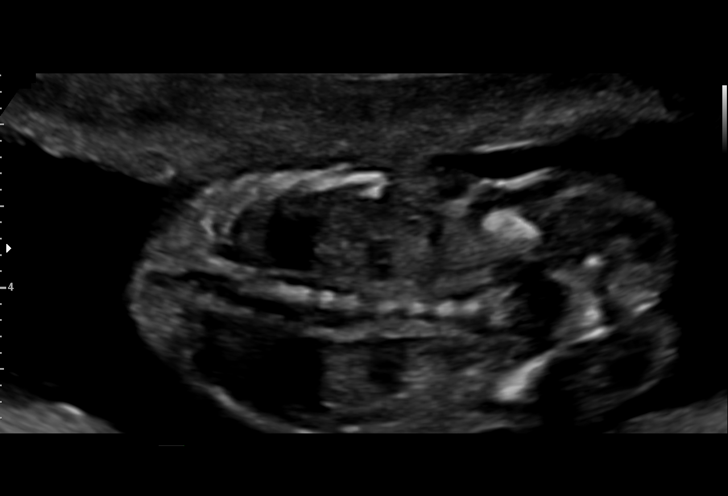
[im 26/100]
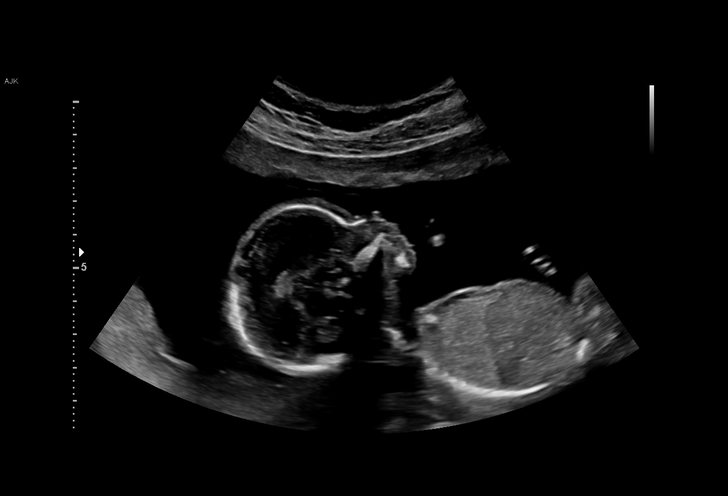
[im 34/100]
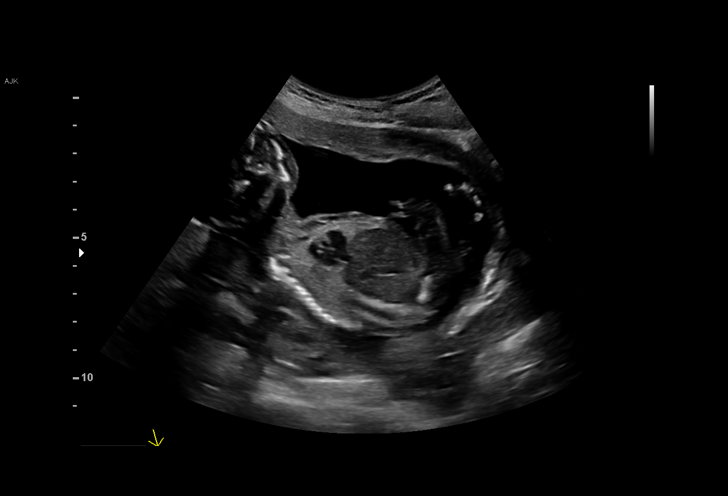
[im 41/100]
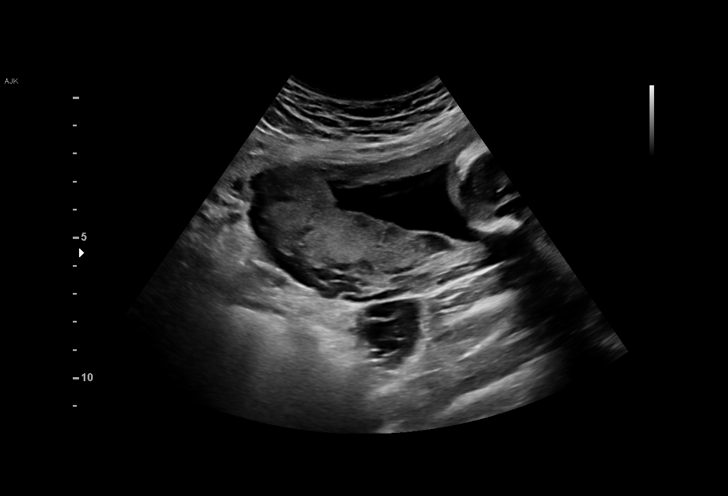
[im 48/100]
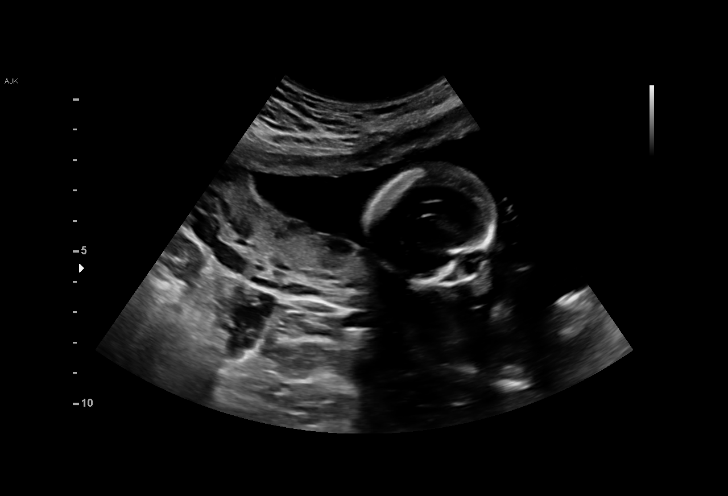
[im 56/100]
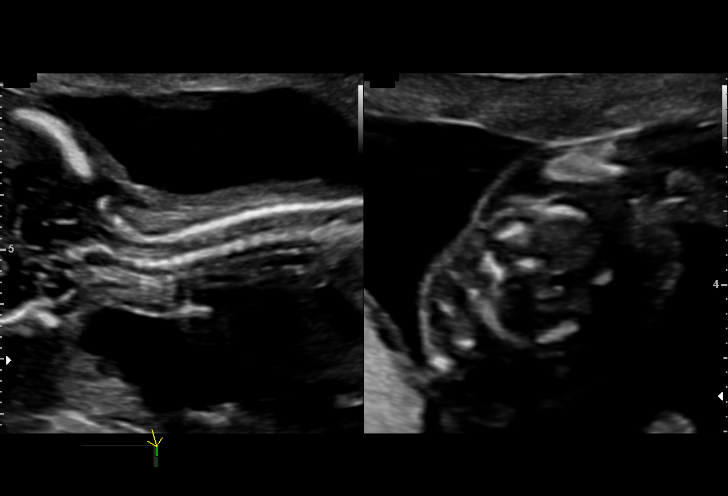
[im 63/100]
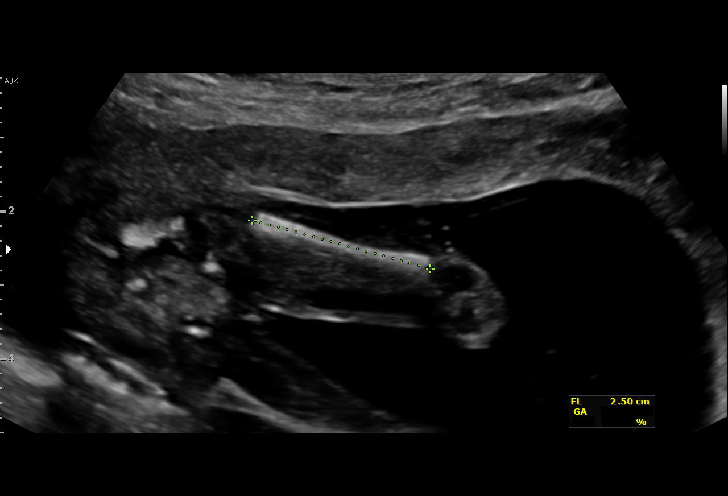
[im 70/100]
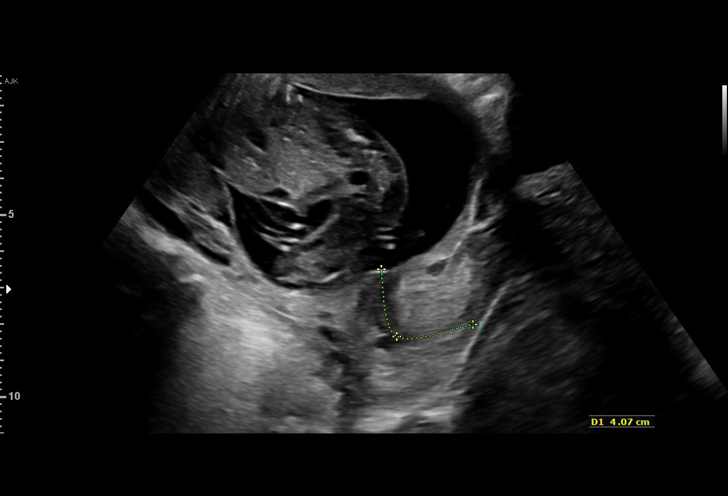
[im 78/100]
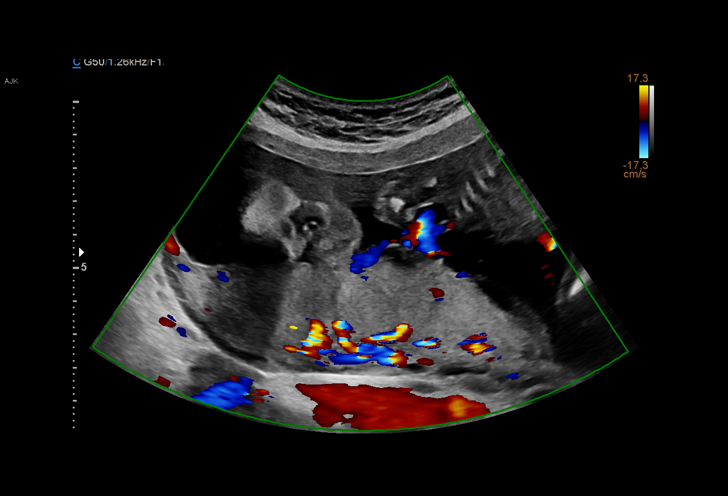
[im 85/100]
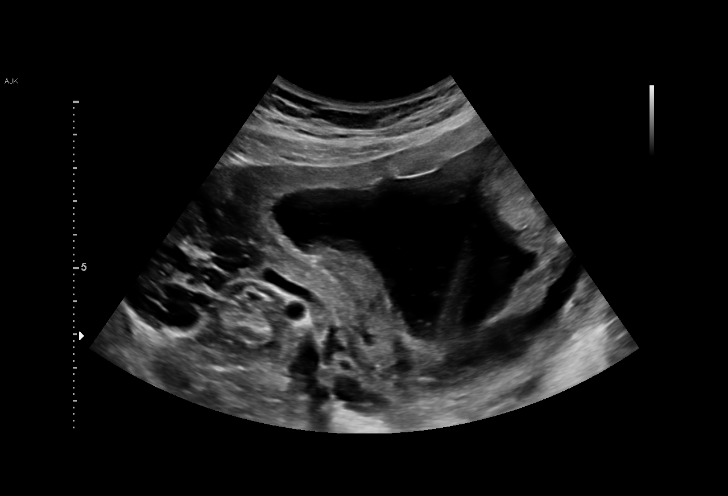
[im 92/100]
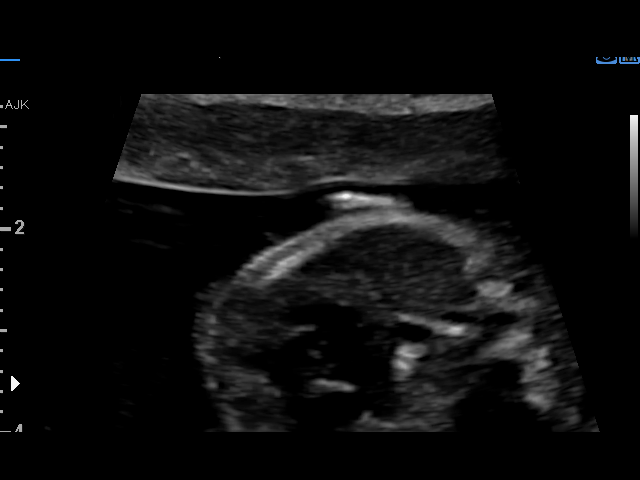
[im 100/100]
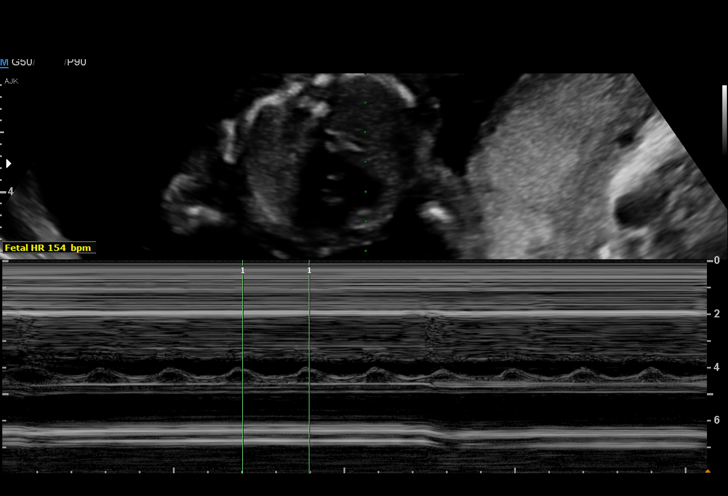

[14 of 28 positions shown; findings below may reference images not displayed]

1  AKHI AKTAR KOKY          [id number]      [id number]     [id number]
Indications

18 weeks gestation of pregnancy
Short interval between pregancies, 2nd
trimester
Encounter for antenatal screening for
malformations
Previous cesarean delivery, antepartum
OB History

Blood Type:            Height:  5'0"   Weight (lb):  126       BMI:
Gravidity:    2         Term:   1
Fetal Evaluation

Num Of Fetuses:     1
Fetal Heart         154
Rate(bpm):
Cardiac Activity:   Observed
Presentation:       Variable
Placenta:           Posterior
P. Cord Insertion:  Visualized, central

Amniotic Fluid
AFI FV:      Subjectively within normal limits

Largest Pocket(cm)
4.72
Biometry

BPD:      41.3  mm     G. Age:  18w 3d         67  %    CI:        74.65   %    70 - 86
FL/HC:      16.9   %    15.8 - 18
HC:      151.7  mm     G. Age:  18w 1d         45  %    HC/AC:      1.19        1.07 -
AC:       128   mm     G. Age:  18w 3d         55  %    FL/BPD:     62.2   %
FL:       25.7  mm     G. Age:  17w 6d         32  %    FL/AC:      20.1   %    20 - 24
HUM:      24.3  mm     G. Age:  17w 4d         37  %
CER:      18.8  mm     G. Age:  18w 3d         58  %
NFT:       4.1  mm

CM:        3.1  mm

Est. FW:     225  gm      0 lb 8 oz     48  %
Gestational Age

LMP:           18w 1d        Date:  01/31/18                 EDD:   11/07/18
U/S Today:     18w 2d                                        EDD:   11/06/18
Best:          18w 1d     Det. By:  LMP  (01/31/18)          EDD:   11/07/18
Anatomy

Cranium:               Appears normal         LVOT:                   Appears normal
Cavum:                 Appears normal         Aortic Arch:            Appears normal
Ventricles:            Appears normal         Ductal Arch:            Appears normal
Choroid Plexus:        Appears normal         Diaphragm:              Appears normal
Cerebellum:            Appears normal         Stomach:                Appears normal, left
sided
Posterior Fossa:       Appears normal         Abdomen:                Appears normal
Nuchal Fold:           Appears normal         Abdominal Wall:         Appears nml (cord
insert, abd wall)
Face:                  Appears normal         Cord Vessels:           Appears normal (3
(orbits and profile)                           vessel cord)
Lips:                  Appears normal         Kidneys:                Appear normal
Palate:                Appears normal         Bladder:                Appears normal
Thoracic:              Appears normal         Spine:                  Appears normal
Heart:                 Appears normal         Upper Extremities:      Appears normal
(4CH, axis, and
situs)
RVOT:                  Appears normal         Lower Extremities:      Appears normal

Other:  Fetus appears to be a male. Heels and RT 5th digit visualized. Nasal
bone visualized.
Cervix Uterus Adnexa

Cervix
Length:            4.1  cm.
Normal appearance by transabdominal scan.

Uterus
No abnormality visualized.

Left Ovary
Within normal limits.

Right Ovary
Within normal limits.

Adnexa:       No abnormality visualized.
Impression

We performed fetal anatomy scan. No makers of
aneuploidies or fetal structural defects are seen. Fetal
biometry is consistent with her previously-established dates.
Amniotic fluid is normal and good fetal activity is seen.
Placenta is posterior and there is no evidence of previa or
accreta.
Recommendations

Follow-up scans as clinically indicated.

## 2019-06-22 DIAGNOSIS — Z20828 Contact with and (suspected) exposure to other viral communicable diseases: Secondary | ICD-10-CM | POA: Diagnosis not present

## 2019-09-22 ENCOUNTER — Other Ambulatory Visit (INDEPENDENT_AMBULATORY_CARE_PROVIDER_SITE_OTHER): Payer: Medicaid Other

## 2019-09-22 ENCOUNTER — Encounter: Payer: Self-pay | Admitting: *Deleted

## 2019-09-22 ENCOUNTER — Other Ambulatory Visit: Payer: Self-pay

## 2019-09-22 VITALS — BP 118/84 | HR 87

## 2019-09-22 DIAGNOSIS — R3915 Urgency of urination: Secondary | ICD-10-CM

## 2019-09-22 LAB — POCT URINALYSIS DIPSTICK: Nitrite, UA: POSITIVE

## 2019-09-22 MED ORDER — SULFAMETHOXAZOLE-TRIMETHOPRIM 800-160 MG PO TABS: 1.0000 | ORAL_TABLET | Freq: Two times a day (BID) | ORAL | 0 refills | Status: AC

## 2019-09-22 MED ORDER — PHENAZOPYRIDINE HCL 200 MG PO TABS: 200.0000 mg | ORAL_TABLET | Freq: Three times a day (TID) | ORAL | 1 refills | Status: DC | PRN

## 2019-09-22 NOTE — Progress Notes (Signed)
SUBJECTIVE: Deanna Perez is a 23 y.o. female who complains of urinary urgency x 1 month, without flank pain, fever, chills, or abnormal vaginal discharge or bleeding.   OBJECTIVE: Appears well, in no apparent distress.  Vital signs are normal. Urine dipstick shows positive for WBC's and positive for nitrates.    ASSESSMENT: Dysuria  PLAN: Treatment per orders.  Call or return to clinic prn if these symptoms worsen or fail to improve as anticipated.

## 2019-09-24 LAB — URINE CULTURE

## 2019-09-25 IMAGING — US US MFM OB FOLLOW-UP
1 series · 14 of 24 positions shown · non-contrast
Comparison: none

[Series 1: us mfm ob follow-up · 14 of 24 slices shown]
[im 1/24]
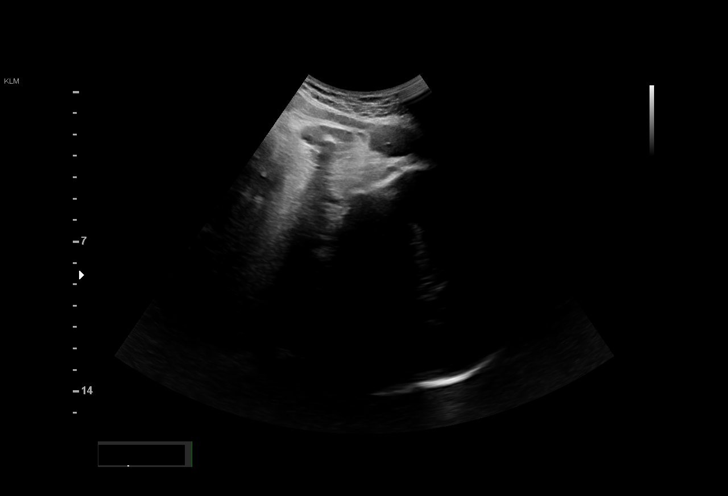
[im 3/24]
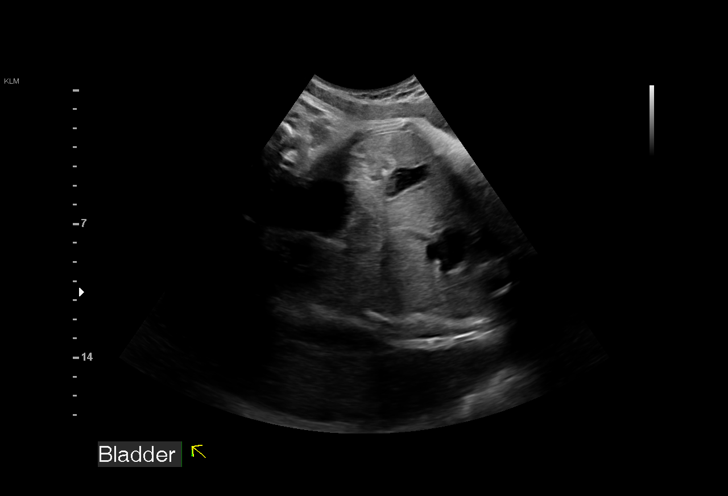
[im 5/24]
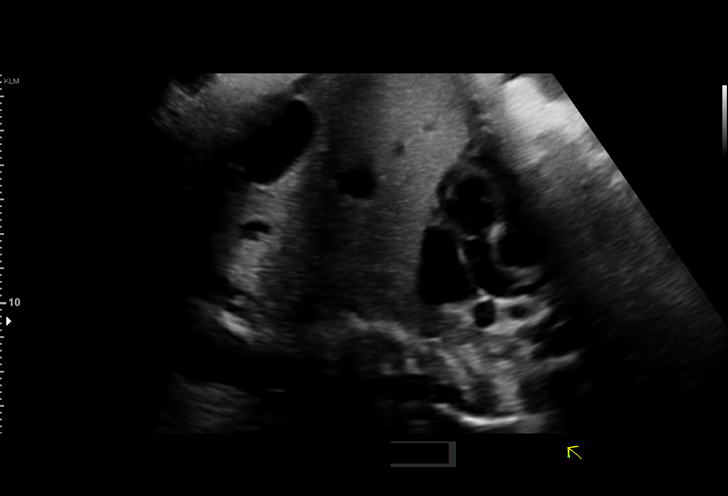
[im 7/24]
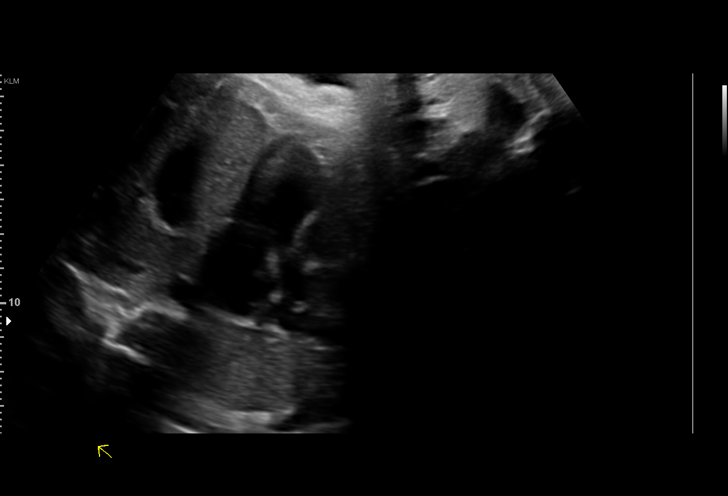
[im 8/24]
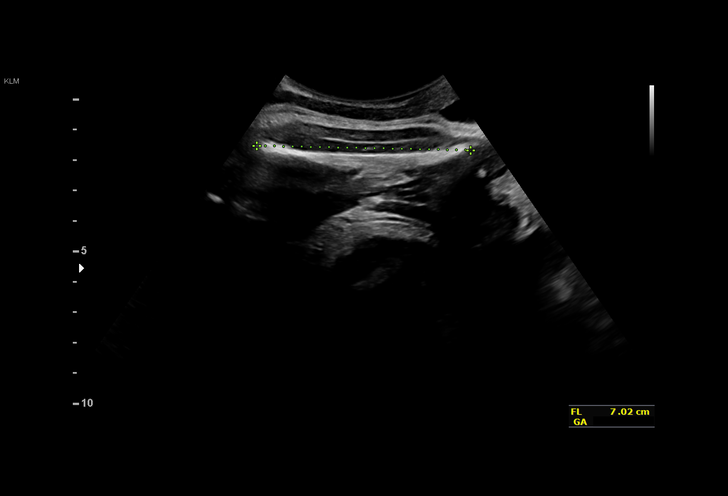
[im 10/24]
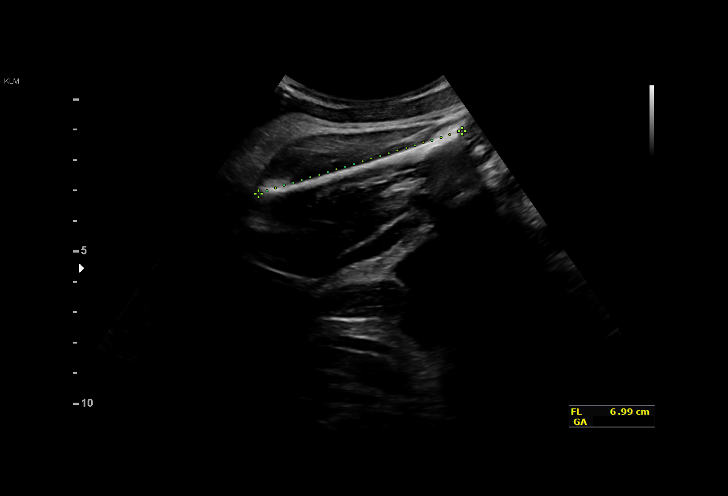
[im 12/24]
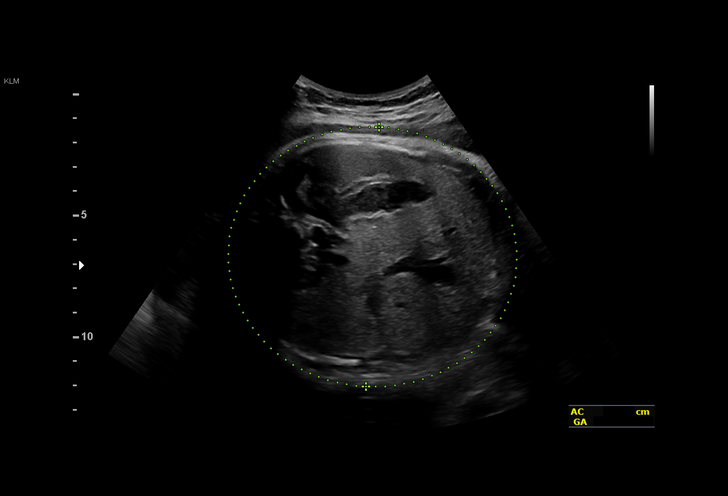
[im 13/24]
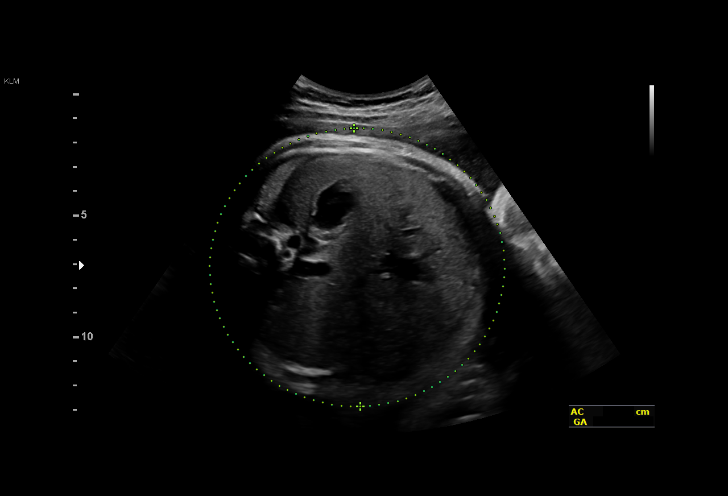
[im 15/24]
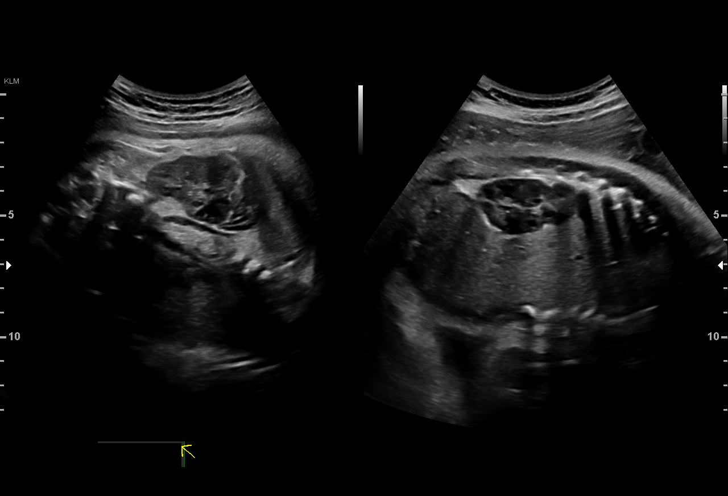
[im 17/24]
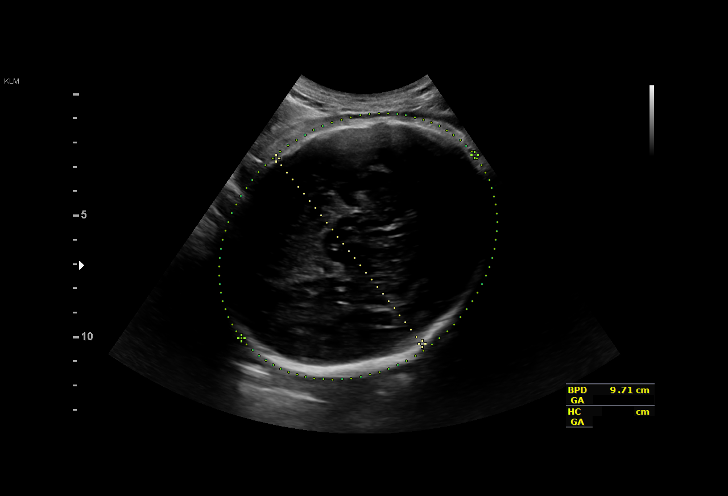
[im 19/24]
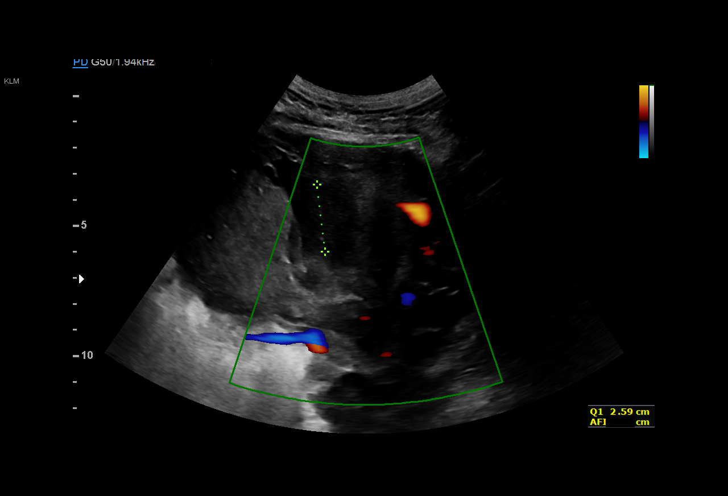
[im 20/24]
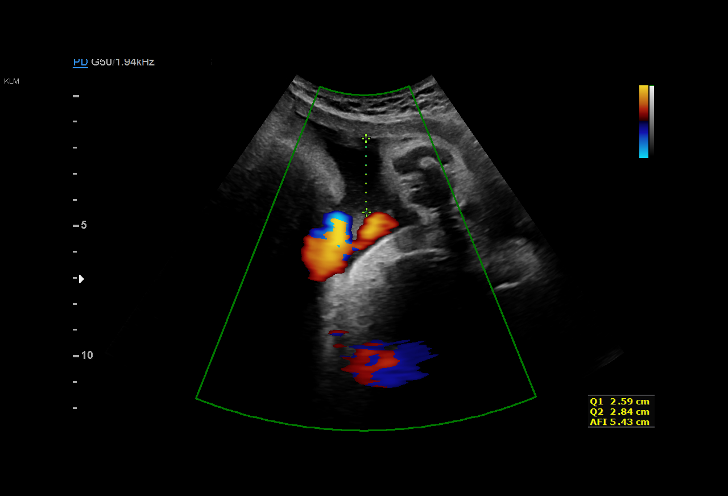
[im 22/24]
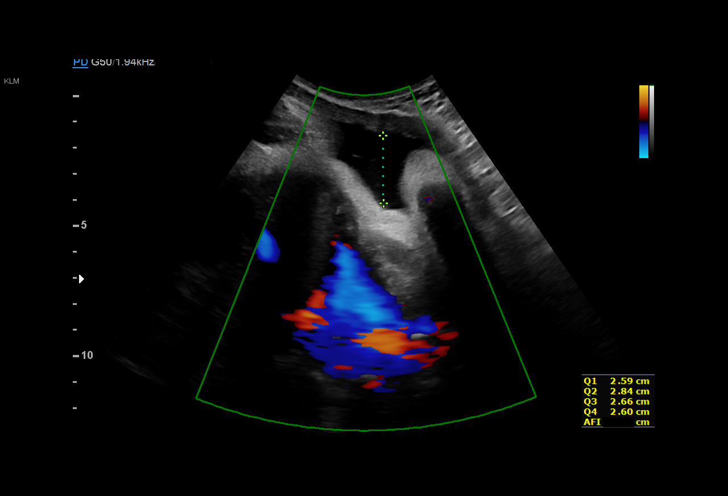
[im 24/24]
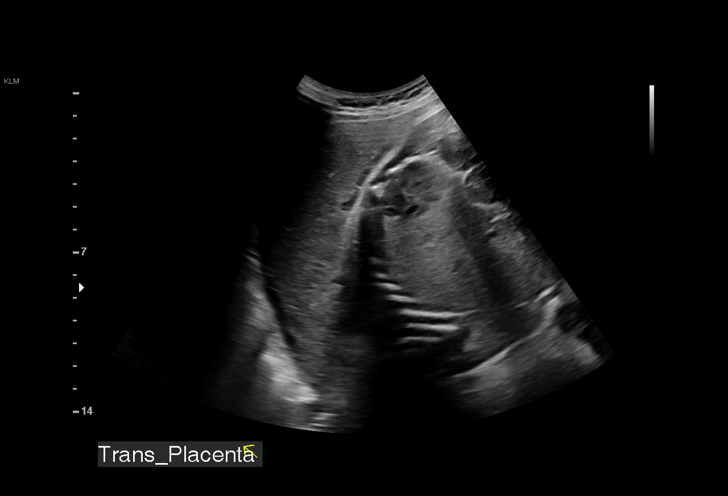

[14 of 24 positions shown; findings below may reference images not displayed]

----------------------------------------------------------------------

 ----------------------------------------------------------------------
Indications

  Uterine size-date discrepancy, third trimester
  39 weeks gestation of pregnancy
  Short interval between pregancies, 2nd
  trimester
  Previous cesarean delivery, antepartum
 ----------------------------------------------------------------------
Vital Signs

                                                Height:        5'0"
Fetal Evaluation

 Num Of Fetuses:          1
 Fetal Heart              140
 Rate(bpm):
 Cardiac Activity:        Observed
 Presentation:            Cephalic
 Placenta:                Fundal
 P. Cord Insertion:       Previously Visualized

 Amniotic Fluid
 AFI FV:      Within normal limits

 AFI Sum(cm)     %Tile       Largest Pocket(cm)
 10.69           34

 RUQ(cm)       RLQ(cm)       LUQ(cm)        LLQ(cm)

Biometry
 BPD:      97.3  mm     G. Age:  39w 6d         92  %    CI:         78.16  %    70 - 86
                                                         FL/HC:       19.9  %    20.6 -
 HC:      348.2  mm     G. Age:  40w 3d         69  %    HC/AC:       0.96       0.87 -
 AC:      362.2  mm     G. Age:  40w 1d         91  %    FL/BPD:      71.3  %    71 - 87
 FL:       69.4  mm     G. Age:  35w 4d        < 3  %    FL/AC:       19.2  %    20 - 24

 Est. FW:    5470   g      8 lb 3 oz     87  %
                    m
OB History

 Gravidity:    2         Term:   1
Gestational Age

 LMP:           39w 1d        Date:  01/31/18                 EDD:    11/07/18
 U/S Today:     39w 0d                                        EDD:    11/08/18
 Best:          39w 1d     Det. By:  LMP  (01/31/18)          EDD:    11/07/18
Anatomy

 Cranium:               Appears normal         LVOT:                   Appears normal
 Cavum:                 Previously seen        Aortic Arch:            Previously seen
 Ventricles:            Previously seen        Ductal Arch:            Previously seen
 Choroid Plexus:        Previously seen        Diaphragm:              Previously seen
 Cerebellum:            Previously seen        Stomach:                Appears normal,
                                                                       left sided
 Posterior Fossa:       Previously seen        Abdomen:                Appears normal
 Nuchal Fold:           Previously seen        Abdominal Wall:         Previously seen
 Face:                  Orbits and profile     Cord Vessels:           Previously seen
                        previously seen
 Lips:                  Previously seen        Kidneys:                Appear normal
 Palate:                Previously seen        Bladder:                Appears normal
 Thoracic:              Appears normal         Spine:                  Previously seen
 Heart:                 Appears normal         Upper Extremities:      Previously seen
                        (4CH, axis, and
                        situs)
 RVOT:                  Previously seen        Lower Extremities:      Previously seen

 Other:  Fetus appears to be a male. Heels and RT 5th digit previously
         visualized. Nasal bone previously visualized.
Cervix Uterus Adnexa

 Cervix
 Not visualized (advanced GA >11wks)
Impression

 Normal interval growth.
Recommendations

 Follow up as clinically indicated.

## 2020-01-10 ENCOUNTER — Telehealth: Payer: Self-pay

## 2020-01-10 ENCOUNTER — Other Ambulatory Visit: Payer: Self-pay

## 2020-01-10 DIAGNOSIS — B009 Herpesviral infection, unspecified: Secondary | ICD-10-CM

## 2020-01-10 MED ORDER — VALACYCLOVIR HCL 1 G PO TABS: 1000.0000 mg | ORAL_TABLET | Freq: Every day | ORAL | 5 refills | Status: DC

## 2020-01-10 NOTE — Telephone Encounter (Signed)
Refill on Valtrex. 

## 2020-06-04 ENCOUNTER — Ambulatory Visit (INDEPENDENT_AMBULATORY_CARE_PROVIDER_SITE_OTHER): Payer: Medicaid Other | Admitting: *Deleted

## 2020-06-04 ENCOUNTER — Other Ambulatory Visit: Payer: Self-pay

## 2020-06-04 VITALS — BP 110/71 | HR 70

## 2020-06-04 DIAGNOSIS — R3915 Urgency of urination: Secondary | ICD-10-CM

## 2020-06-04 LAB — POCT URINALYSIS DIPSTICK

## 2020-06-04 MED ORDER — SULFAMETHOXAZOLE-TRIMETHOPRIM 800-160 MG PO TABS: 1.0000 | ORAL_TABLET | Freq: Two times a day (BID) | ORAL | 0 refills | Status: AC

## 2020-06-04 NOTE — Progress Notes (Signed)
SUBJECTIVE: Deanna Perez is a 24 y.o. female who complains of urinary urgency and cloudy urine x few days, without flank pain, fever, chills, or abnormal vaginal discharge or bleeding.   OBJECTIVE: Appears well, in no apparent distress.  Vital signs are normal. Urine dipstick shows positive for RBC's and positive for leukocytes.    ASSESSMENT: Dysuria  PLAN: Treatment per orders.  Call or return to clinic prn if these symptoms worsen or fail to improve as anticipated.

## 2020-06-05 LAB — URINE CULTURE

## 2020-06-05 NOTE — Progress Notes (Signed)
Patient was assessed and managed by nursing staff during this encounter. I have reviewed the chart and agree with the documentation and plan. I have also made any necessary editorial changes.  Espn Zeman, MD 06/05/2020 8:32 AM   

## 2020-09-02 ENCOUNTER — Other Ambulatory Visit: Payer: Self-pay

## 2020-09-02 ENCOUNTER — Ambulatory Visit
Admission: EM | Admit: 2020-09-02 | Discharge: 2020-09-02 | Disposition: A | Discharge status: Home / Self Care | Payer: Medicaid Other | Attending: Emergency Medicine | Admitting: Emergency Medicine

## 2020-09-02 DIAGNOSIS — J029 Acute pharyngitis, unspecified: Secondary | ICD-10-CM | POA: Insufficient documentation

## 2020-09-02 LAB — POCT RAPID STREP A (OFFICE): Rapid Strep A Screen: NEGATIVE

## 2020-09-02 NOTE — ED Provider Notes (Signed)
[facility]    CSN: [id number] Arrival date & time: 09/02/20  1602      History   Chief Complaint Chief Complaint  Patient presents with  . Sore Throat    since Friday    HPI Deanna Perez is a 24 y.o. female  Subjective:   History was provided by the patient. Deanna Perez is a 24 y.o. female who presents for evaluation of a sore throat. Associated symptoms include sore throat. Onset of symptoms was 4 days ago, gradually worsening since that time.  She is drinking plenty of fluids. She has had recent close exposure to someone with proven streptococcal pharyngitis.  Covid vaccinated, works in [employer] [facility] [employer] setting. The following portions of the patient's history were reviewed and updated as appropriate: allergies, current medications, past family history, past medical history, past social history, past surgical history and problem list.      Past Medical History:  Diagnosis Date  . Cholestasis during pregnancy in third trimester 09/27/2017  . Gonorrhea   . Herpes genitalis in women 11/03/2017    Patient Active Problem List   Diagnosis Date Noted  . H/O cesarean section 04/27/2018  . 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 CESAREAN SECTION N/A 09/28/2017   Procedure: CESAREAN SECTION;  Surgeon: Levie Heritage, DO;  Location: [facility] BIRTHING SUITES;  Service: Obstetrics;  Laterality: N/A;    OB History    Gravida  2   Para  2   Term  2   Preterm  0   AB  0   Living  2     SAB  0   TAB  0   Ectopic  0   Multiple  0   Live Births  2            Home Medications    Prior to Admission medications   Medication Sig Start Date End Date Taking? Authorizing Provider  etonogestrel (NEXPLANON) 68 MG IMPL implant 1 each by Subdermal route once. 12/22/18  Yes [provider]  valACYclovir (VALTREX) 1000 MG tablet Take 1 tablet (1,000 mg total) by mouth daily. 01/10/20   Federico Flake, MD     Family History Family History  Problem Relation Age of Onset  . Hypertension Mother   . Hypertension Father   . Heart disease Paternal Aunt   . Hypertension Paternal Grandmother   . Diabetes Paternal Grandmother   . Cancer Paternal Grandmother   . Hypertension Paternal Grandfather     Social History Social History   Tobacco Use  . Smoking status: Never Smoker  . Smokeless tobacco: Never Used  Vaping Use  . Vaping Use: Never used  Substance Use Topics  . Alcohol use: No    Comment: occasional  . Drug use: No     Allergies   Patient has no known allergies.   Review of Systems Review of Systems  Constitutional: Negative for fatigue and fever.  HENT: Positive for sore throat. Negative for ear pain, sinus pain and voice change.   Eyes: Negative for pain, redness and visual disturbance.  Respiratory: Negative for cough and shortness of breath.   Cardiovascular: Negative for chest pain and palpitations.  Gastrointestinal: Negative for abdominal pain, diarrhea and vomiting.  Musculoskeletal: Negative for arthralgias and myalgias.  Skin: Negative for rash and wound.  Neurological: Negative for syncope and headaches.     Physical Exam Triage Vital Signs ED Triage Vitals  Enc  Vitals Group     BP      Pulse      Resp      Temp      Temp src      SpO2      Weight      Height      Head Circumference      Peak Flow      Pain Score      Pain Loc      Pain Edu?      Excl. in GC?    No data found.  Updated Vital Signs BP 117/83 (BP Location: Right Arm)   Pulse 80   Temp 98.5 F (36.9 C) (Oral)   Resp 20   LMP 09/02/2020 (Exact Date)   SpO2 96%   Visual Acuity Right Eye Distance:   Left Eye Distance:   Bilateral Distance:    Right Eye Near:   Left Eye Near:    Bilateral Near:     Physical Exam Constitutional:      General: She is not in acute distress.    Appearance: She is well-developed. She is not ill-appearing.  HENT:     Head:  Normocephalic and atraumatic.     Jaw: There is normal jaw occlusion. No tenderness or pain on movement.     Right Ear: Hearing, tympanic membrane, ear canal and external ear normal. No tenderness. No mastoid tenderness.     Left Ear: Hearing, tympanic membrane, ear canal and external ear normal. No tenderness. No mastoid tenderness.     Nose: No nasal deformity, septal deviation or nasal tenderness.     Right Turbinates: Not swollen or pale.     Left Turbinates: Not swollen or pale.     Right Sinus: No maxillary sinus tenderness or frontal sinus tenderness.     Left Sinus: No maxillary sinus tenderness or frontal sinus tenderness.     Mouth/Throat:     Lips: Pink. No lesions.     Mouth: Mucous membranes are moist. No injury.     Pharynx: Oropharynx is clear. Uvula midline. Posterior oropharyngeal erythema present. No uvula swelling.     Tonsils: 2+ on the right. 2+ on the left.     Comments: no tonsillar exudate or hypertrophy Cardiovascular:     Rate and Rhythm: Normal rate.  Pulmonary:     Effort: Pulmonary effort is normal.  Musculoskeletal:     Cervical back: Normal range of motion and neck supple. No muscular tenderness.  Lymphadenopathy:     Cervical: No cervical adenopathy.  Neurological:     Mental Status: She is alert and oriented to person, place, and time.      UC Treatments / Results  Labs (all labs ordered are listed, but only abnormal results are displayed) Labs Reviewed  NOVEL CORONAVIRUS, NAA  CULTURE, GROUP A STREP [facility])  POCT RAPID STREP A (OFFICE)    EKG   Radiology No results found.  Procedures Procedures (including critical care time)  Medications Ordered in UC Medications - No data to display  Initial Impression / Assessment and Plan / UC Course  I have reviewed the triage vital signs and the nursing notes.  Pertinent labs & imaging results that were available during my care of the patient were reviewed by me and considered in my medical  decision making (see chart for details).     Patient afebrile, nontoxic, with SpO2 96%.  Rapid strep negative, culture pending.  Covid PCR pending.  Patient to  quarantine until results are back.  We will treat supportively as outlined below.  Return precautions discussed, patient verbalized understanding and is agreeable to plan. Final Clinical Impressions(s) / UC Diagnoses   Final diagnoses:  Sore throat     Discharge Instructions     Your rapid strep test was negative today.  The culture is pending.  Please look on your MyChart for test results.   We will notify you if the culture positive and outline a treatment plan at that time.   Please continue Tylenol and/or Ibuprofen as needed for fever, pain.  May try warm salt water gargles, cepacol lozenges, throat spray, warm tea or water with lemon/honey, or OTC cold relief medicine for throat discomfort.   For congestion: take a daily anti-histamine like Zyrtec, Claritin, and a oral decongestant to help with post nasal drip that may be irritating your throat.   It is important to stay hydrated: drink plenty of fluids (primarily water) to keep your throat moisturized and help further relieve irritation/discomfort.     ED Prescriptions    None     PDMP not reviewed this encounter.   Hall-Potvin, [place], [place] 09/02/20 1718

## 2020-09-02 NOTE — ED Triage Notes (Signed)
Pt states she has had a sore throat since Friday and it has become very painful especially when talking or eating. Pt sister diagnosed with Strep. Pt is aox4 and ambulatory.

## 2020-09-02 NOTE — Discharge Instructions (Addendum)

## 2020-09-04 LAB — SARS-COV-2, NAA 2 DAY TAT

## 2020-09-04 LAB — NOVEL CORONAVIRUS, NAA: SARS-CoV-2, NAA: NOT DETECTED

## 2020-09-05 ENCOUNTER — Ambulatory Visit
Admission: RE | Admit: 2020-09-05 | Discharge: 2020-09-05 | Disposition: A | Discharge status: Home / Self Care | Payer: Medicaid Other | Source: Ambulatory Visit | Attending: Emergency Medicine | Admitting: Emergency Medicine

## 2020-09-05 ENCOUNTER — Other Ambulatory Visit: Payer: Self-pay

## 2020-09-05 VITALS — BP 118/81 | HR 77 | Temp 98.4°F | Resp 18

## 2020-09-05 DIAGNOSIS — J029 Acute pharyngitis, unspecified: Secondary | ICD-10-CM

## 2020-09-05 LAB — CULTURE, GROUP A STREP (THRC)

## 2020-09-05 LAB — CBC
Hematocrit: 38.4 % (ref 34.0–46.6)
Hemoglobin: 12.5 g/dL (ref 11.1–15.9)
MCH: 27.5 pg (ref 26.6–33.0)
MCHC: 32.6 g/dL (ref 31.5–35.7)
MCV: 84 fL (ref 79–97)
Platelets: 280 10*3/uL (ref 150–450)
RBC: 4.55 x10E6/uL (ref 3.77–5.28)
RDW: 12.7 % (ref 11.7–15.4)
WBC: 6.1 10*3/uL (ref 3.4–10.8)

## 2020-09-05 MED ORDER — PREDNISONE 20 MG PO TABS: 20.0000 mg | ORAL_TABLET | Freq: Every day | ORAL | 0 refills | Status: DC

## 2020-09-05 NOTE — ED Provider Notes (Signed)
[facility]    CSN: [id number] Arrival date & time: 09/05/20  1444      History   Chief Complaint Chief Complaint  Patient presents with  . Sore Throat    since last Friday    HPI Deanna Perez is a 24 y.o. female  Presenting for persistent sore throat.  For this initially by me on 09/02/2020: Please see those records.  Rapid strep negative, culture still pending.  Covid negative.  Past Medical History:  Diagnosis Date  . Cholestasis during pregnancy in third trimester 09/27/2017  . Gonorrhea   . Herpes genitalis in women 11/03/2017    Patient Active Problem List   Diagnosis Date Noted  . H/O cesarean section 04/27/2018  . 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 CESAREAN SECTION N/A 09/28/2017   Procedure: CESAREAN SECTION;  Surgeon: Levie Heritage, DO;  Location: [facility] BIRTHING SUITES;  Service: Obstetrics;  Laterality: N/A;    OB History    Gravida  2   Para  2   Term  2   Preterm  0   AB  0   Living  2     SAB  0   TAB  0   Ectopic  0   Multiple  0   Live Births  2            Home Medications    Prior to Admission medications   Medication Sig Start Date End Date Taking? Authorizing Provider  etonogestrel (NEXPLANON) 68 MG IMPL implant 1 each by Subdermal route once. 12/22/18  Yes [provider]  predniSONE (DELTASONE) 20 MG tablet Take 1 tablet (20 mg total) by mouth daily. 09/05/20   Hall-Potvin, [place], PA-C  valACYclovir (VALTREX) 1000 MG tablet Take 1 tablet (1,000 mg total) by mouth daily. 01/10/20   Federico Flake, MD    Family History Family History  Problem Relation Age of Onset  . Hypertension Mother   . Hypertension Father   . Heart disease Paternal Aunt   . Hypertension Paternal Grandmother   . Diabetes Paternal Grandmother   . Cancer Paternal Grandmother   . Hypertension Paternal Grandfather     Social History Social History   Tobacco Use    . Smoking status: Never Smoker  . Smokeless tobacco: Never Used  Vaping Use  . Vaping Use: Never used  Substance Use Topics  . Alcohol use: No    Comment: occasional  . Drug use: No     Allergies   Patient has no known allergies.   Review of Systems Review of Systems  Constitutional: Negative for fatigue and fever.  HENT: Positive for sore throat. Negative for ear pain, sinus pain and voice change.   Eyes: Negative for pain, redness and visual disturbance.  Respiratory: Negative for cough and shortness of breath.   Cardiovascular: Negative for chest pain and palpitations.  Gastrointestinal: Negative for abdominal pain, diarrhea and vomiting.  Musculoskeletal: Negative for arthralgias and myalgias.  Skin: Negative for rash and wound.  Neurological: Negative for syncope and headaches.     Physical Exam Triage Vital Signs ED Triage Vitals  Enc Vitals Group     BP      Pulse      Resp      Temp      Temp src      SpO2      Weight      Height  Head Circumference      Peak Flow      Pain Score      Pain Loc      Pain Edu?      Excl. in GC?    No data found.  Updated Vital Signs BP 118/81 (BP Location: Left Arm)   Pulse 77   Temp 98.4 F (36.9 C) (Oral)   Resp 18   LMP 09/02/2020 (Exact Date)   SpO2 98%   Visual Acuity Right Eye Distance:   Left Eye Distance:   Bilateral Distance:    Right Eye Near:   Left Eye Near:    Bilateral Near:     Physical Exam Constitutional:      General: She is not in acute distress. HENT:     Head: Normocephalic and atraumatic.     Right Ear: Tympanic membrane and ear canal normal.     Left Ear: Tympanic membrane and ear canal normal.     Mouth/Throat:     Pharynx: Oropharynx is clear. Uvula midline. No posterior oropharyngeal erythema or uvula swelling.     Tonsils: No tonsillar exudate. 1+ on the right. 1+ on the left.  Eyes:     General: No scleral icterus.    Conjunctiva/sclera: Conjunctivae normal.      Pupils: Pupils are equal, round, and reactive to light.  Neck:     Comments: Trachea midline, negative JVD.  No crepitus, thyromegaly Cardiovascular:     Rate and Rhythm: Normal rate.  Pulmonary:     Effort: Pulmonary effort is normal.  Musculoskeletal:     Cervical back: Normal range of motion and neck supple.  Lymphadenopathy:     Cervical: Cervical adenopathy present.  Skin:    Coloration: Skin is not jaundiced or pale.  Neurological:     Mental Status: She is alert and oriented to person, place, and time.      UC Treatments / Results  Labs (all labs ordered are listed, but only abnormal results are displayed) Labs Reviewed  CBC    EKG   Radiology No results found.  Procedures Procedures (including critical care time)  Medications Ordered in UC Medications - No data to display  Initial Impression / Assessment and Plan / UC Course  I have reviewed the triage vital signs and the nursing notes.  Pertinent labs & imaging results that were available during my care of the patient were reviewed by me and considered in my medical decision making (see chart for details).     Patient afebrile, nontoxic in office today.  Largely stable without other systemic symptoms.  No airway obstruction.  Patient does have bilateral cervical LAD.  Suspect viral process, will trial prednisone, follow-up with ENT.  CBC pending in the interim.  Return precautions discussed, pt verbalized understanding and is agreeable to plan. Final Clinical Impressions(s) / UC Diagnoses   Final diagnoses:  Sore throat   Discharge Instructions   None    ED Prescriptions    Medication Sig Dispense Auth. Provider   predniSONE (DELTASONE) 20 MG tablet Take 1 tablet (20 mg total) by mouth daily. 5 tablet Hall-Potvin, [place], PA-C     PDMP not reviewed this encounter.   Hall-Potvin, [place], [place] 09/05/20 1540

## 2020-09-05 NOTE — ED Triage Notes (Signed)
Pt returned as sore throat has become progressively worse but her strep and covid tests returned with negative results. Pt is aox4 and ambulatory.

## 2020-09-06 ENCOUNTER — Telehealth (HOSPITAL_COMMUNITY): Payer: Self-pay | Admitting: Emergency Medicine

## 2020-09-06 LAB — CULTURE, GROUP A STREP (THRC)

## 2020-09-06 MED ORDER — PENICILLIN V POTASSIUM 500 MG PO TABS: 500.0000 mg | ORAL_TABLET | Freq: Two times a day (BID) | ORAL | 0 refills | Status: AC

## 2021-08-27 ENCOUNTER — Encounter: Payer: Self-pay | Admitting: Emergency Medicine

## 2021-08-27 ENCOUNTER — Other Ambulatory Visit: Payer: Self-pay

## 2021-08-27 ENCOUNTER — Ambulatory Visit
Admission: EM | Admit: 2021-08-27 | Discharge: 2021-08-27 | Disposition: A | Discharge status: Home / Self Care | Payer: Medicaid Other | Attending: Internal Medicine | Admitting: Internal Medicine

## 2021-08-27 DIAGNOSIS — Z20822 Contact with and (suspected) exposure to covid-19: Secondary | ICD-10-CM | POA: Diagnosis not present

## 2021-08-27 DIAGNOSIS — J029 Acute pharyngitis, unspecified: Secondary | ICD-10-CM | POA: Diagnosis not present

## 2021-08-27 LAB — POCT RAPID STREP A (OFFICE): Rapid Strep A Screen: NEGATIVE

## 2021-08-27 NOTE — ED Triage Notes (Signed)
Patient c/o sore throat and HA x 2 days.  Afebrile.  Requesting a strep and COVID test.  Patient is vaccinated for COVID.

## 2021-08-27 NOTE — Discharge Instructions (Signed)
Your rapid strep test was negative.  Throat culture and COVID-19 viral swab are pending.  We will call if they are positive.  Recommend Cepacol throat lozenges over-the-counter to help alleviate sore throat.

## 2021-08-27 NOTE — ED Provider Notes (Addendum)
[facility]    CSN: [id number] Arrival date & time: 08/27/21  1403      History   Chief Complaint Chief Complaint  Patient presents with   Sore Throat    HPI Deanna Perez is a 25 y.o. female.   Patient presents with a 2-day history of sore throat and headache.  Denies any upper respiratory symptoms, cough, fever, chest pain, shortness of breath.  Denies any known sick contacts.  Patient has taken DayQuil and NyQuil with some improvement in symptoms.   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 Authorizing Provider  etonogestrel (NEXPLANON) 68 MG IMPL implant 1 each by Subdermal route once. 12/22/18  Yes [provider]  predniSONE (DELTASONE) 20 MG tablet Take 1 tablet (20 mg total) by mouth daily. 09/05/20   Hall-Potvin, [place], PA-C  valACYclovir (VALTREX) 1000 MG tablet Take 1 tablet (1,000 mg total) by mouth daily. 01/10/20   Federico Flake, MD    Family History Family History  Problem Relation Age of Onset   Hypertension Mother    Hypertension Father    Heart disease Paternal Aunt    Hypertension Paternal Grandmother    Diabetes Paternal Grandmother    Cancer Paternal Grandmother     Hypertension Paternal Grandfather     Social History Social History   Tobacco Use   Smoking status: Never   Smokeless tobacco: Never  Vaping Use   Vaping Use: Never used  Substance Use Topics   Alcohol use: No    Comment: occasional   Drug use: No     Allergies   Patient has no known allergies.   Review of Systems Review of Systems Per HPI  Physical Exam Triage Vital Signs ED Triage Vitals [08/27/21 1518]  Enc Vitals Group     BP 133/85     Pulse Rate 75     Resp 18     Temp 98.1 F (36.7 C)     Temp Source Oral     SpO2 98 %     Weight 160 lb (72.6 kg)     Height 5\' 1"  (1.549 m)     Head Circumference      Peak Flow      Pain Score 5     Pain Loc      Pain Edu?      Excl. in GC?    No data found.  Updated Vital Signs BP 133/85 (BP Location:  Left Arm)   Pulse 75   Temp 98.1 F (36.7 C) (Oral)   Resp 18   Ht 5\' 1"  (1.549 m)   Wt 160 lb (72.6 kg)   LMP 08/13/2021   SpO2 98%   Breastfeeding No   BMI 30.23 kg/m   Visual Acuity Right Eye Distance:   Left Eye Distance:   Bilateral Distance:    Right Eye Near:   Left Eye Near:    Bilateral Near:     Physical Exam Constitutional:      General: She is not in acute distress.    Appearance: Normal appearance. She is not toxic-appearing or diaphoretic.  HENT:     Head: Normocephalic and atraumatic.     Right Ear: Tympanic membrane and ear canal normal.     Left Ear: Tympanic membrane and ear canal normal.     Nose: No congestion.     Mouth/Throat:     Mouth: Mucous membranes are moist.     Pharynx: Posterior oropharyngeal erythema present. No pharyngeal swelling, oropharyngeal exudate or uvula swelling.     Tonsils: No tonsillar exudate or tonsillar abscesses.  Eyes:     Extraocular Movements: Extraocular movements intact.     Conjunctiva/sclera: Conjunctivae normal.     Pupils: Pupils are equal, round, and reactive to light.  Cardiovascular:     Rate and Rhythm: Normal rate and regular  rhythm.     Pulses: Normal pulses.     Heart sounds: Normal heart sounds.  Pulmonary:     Effort: Pulmonary effort is normal. No respiratory distress.     Breath sounds: Normal breath sounds. No wheezing.  Abdominal:     General: Abdomen is flat. Bowel sounds are normal.     Palpations: Abdomen is soft.  Musculoskeletal:        General: Normal range of motion.     Cervical back: Normal range of motion.  Skin:    General: Skin is warm and dry.  Neurological:     General: No focal deficit present.     Mental Status: She is alert and oriented to person, place, and time. Mental status is at baseline.  Psychiatric:        Mood and Affect: Mood normal.        Behavior: Behavior normal.     UC Treatments / Results  Labs (all labs ordered are listed, but only abnormal results are displayed) Labs Reviewed  NOVEL CORONAVIRUS, NAA  CULTURE, GROUP A STREP [facility])  POCT RAPID STREP A (OFFICE)    EKG   Radiology No results found.  Procedures Procedures (including critical care time)  Medications Ordered in UC Medications - No data to display  Initial Impression / Assessment and Plan / UC Course  I have reviewed the triage vital signs and the nursing notes.  Pertinent labs & imaging results that were available during my care of the patient were reviewed by me and considered in my medical decision making (see chart for details).     Suspect viral pharyngitis.  Rapid strep test was negative.  Throat culture and COVID-19 viral swab are pending. Discussed supportive care with patient.no signs of peritonsillar abscess on exam.  Discussed strict return precautions. Patient verbalized understanding and is agreeable with plan.  Final Clinical Impressions(s) / UC Diagnoses   Final diagnoses:  Sore throat  Encounter for laboratory testing for COVID-19 virus     Discharge Instructions      Your rapid strep test was negative.  Throat culture and COVID-19 viral swab are pending.  We  will call if they are positive.  Recommend Cepacol throat lozenges over-the-counter to help alleviate sore throat.     ED Prescriptions   None    PDMP not reviewed this encounter.   Gustavus Bryant, [place] 08/27/21 1549    Gustavus Bryant, [place] 08/27/21 1550

## 2021-08-28 LAB — SARS-COV-2, NAA 2 DAY TAT

## 2021-08-28 LAB — NOVEL CORONAVIRUS, NAA: SARS-CoV-2, NAA: NOT DETECTED

## 2021-08-30 LAB — CULTURE, GROUP A STREP (THRC)

## 2021-09-03 ENCOUNTER — Other Ambulatory Visit: Payer: Self-pay

## 2021-09-03 DIAGNOSIS — B009 Herpesviral infection, unspecified: Secondary | ICD-10-CM

## 2021-09-03 DIAGNOSIS — O98512 Other viral diseases complicating pregnancy, second trimester: Secondary | ICD-10-CM

## 2021-09-03 MED ORDER — VALACYCLOVIR HCL 1 G PO TABS: 1000.0000 mg | ORAL_TABLET | Freq: Every day | ORAL | 5 refills | Status: DC

## 2021-09-03 NOTE — Progress Notes (Signed)
Pt requested refill on Valtrex Rx sent per protocol.

## 2022-03-31 ENCOUNTER — Ambulatory Visit
Admission: RE | Admit: 2022-03-31 | Discharge: 2022-03-31 | Disposition: A | Discharge status: Home / Self Care | Payer: Medicaid Other | Source: Ambulatory Visit | Attending: Internal Medicine | Admitting: Internal Medicine

## 2022-03-31 ENCOUNTER — Other Ambulatory Visit: Payer: Self-pay

## 2022-03-31 VITALS — BP 115/76 | HR 106 | Temp 99.6°F | Resp 18

## 2022-03-31 DIAGNOSIS — J069 Acute upper respiratory infection, unspecified: Secondary | ICD-10-CM | POA: Insufficient documentation

## 2022-03-31 DIAGNOSIS — N390 Urinary tract infection, site not specified: Secondary | ICD-10-CM | POA: Insufficient documentation

## 2022-03-31 DIAGNOSIS — R300 Dysuria: Secondary | ICD-10-CM | POA: Diagnosis not present

## 2022-03-31 DIAGNOSIS — J029 Acute pharyngitis, unspecified: Secondary | ICD-10-CM | POA: Diagnosis not present

## 2022-03-31 LAB — POCT URINALYSIS DIP (MANUAL ENTRY)
Bilirubin, UA: NEGATIVE
Blood, UA: NEGATIVE
Glucose, UA: NEGATIVE mg/dL
Ketones, POC UA: NEGATIVE mg/dL
Nitrite, UA: NEGATIVE
Spec Grav, UA: 1.02 (ref 1.010–1.025)
Urobilinogen, UA: 0.2 E.U./dL
pH, UA: 7 (ref 5.0–8.0)

## 2022-03-31 LAB — POCT URINE PREGNANCY: Preg Test, Ur: NEGATIVE

## 2022-03-31 LAB — POCT RAPID STREP A (OFFICE): Rapid Strep A Screen: NEGATIVE

## 2022-03-31 MED ORDER — CEFDINIR 300 MG PO CAPS: 300.0000 mg | ORAL_CAPSULE | Freq: Two times a day (BID) | ORAL | 0 refills | Status: AC

## 2022-03-31 NOTE — Discharge Instructions (Signed)
Rapid strep is negative.  Throat culture and COVID test are pending.  We will call if they are positive.  It appears that you may have a viral upper respiratory infection that should run its course and self resolve in the next few days with symptomatic treatment.  Your urine is showing signs of urinary tract infection.  You are being treated with an antibiotic to treat this.  Urine culture is pending.  Please follow-up if symptoms persist or worsen.

## 2022-03-31 NOTE — ED Triage Notes (Signed)
Pt here for sore throat x 2 days and dysuria also

## 2022-03-31 NOTE — ED Provider Notes (Signed)
[facility]    CSN: [id number] Arrival date & time: 03/31/22  1542      History   Chief Complaint Chief Complaint  Patient presents with   Sore Throat    Entered by patient   Appointment    1500    HPI Deanna Perez is a 26 y.o. female.   Patient presents with 2 different chief complaints today.  She reports sore throat, cough, nasal congestion that has been present for approximately 2 days.  Her children have similar symptoms currently.  Denies any known fevers at home.  Denies chest pain, shortness of breath, ear pain, nausea, vomiting, diarrhea, abdominal pain.  Patient also endorses some urinary burning that has been present for approximately 1 week.  Denies abdominal pain, fever, vaginal discharge, hematuria, back pain.  Denies any concern for STD.  Last menstrual cycle was approximately 1 month ago.   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 Authorizing Provider  cefdinir (OMNICEF) 300 MG capsule Take 1 capsule (300 mg total) by mouth 2 (two) times daily for 10 days. 03/31/22 04/10/22 Yes Thailyn Khalid, Michele Rockers, FNP  etonogestrel (NEXPLANON) 68 MG IMPL implant 1 each by Subdermal route once. 12/22/18   [provider]   predniSONE (DELTASONE) 20 MG tablet Take 1 tablet (20 mg total) by mouth daily. Patient not taking: Reported on 03/31/2022 09/05/20   Hall-Potvin, [place], PA-C  valACYclovir (VALTREX) 1000 MG tablet Take 1 tablet (1,000 mg total) by mouth daily. 09/03/21   Osborne Oman, MD    Family History Family History  Problem Relation Age of Onset   Hypertension Mother    Hypertension Father    Heart disease Paternal Aunt    Hypertension Paternal Grandmother    Diabetes Paternal Grandmother    Cancer Paternal Grandmother    Hypertension Paternal Grandfather     Social History Social History   Tobacco Use   Smoking status: Never   Smokeless tobacco: Never  Vaping Use   Vaping Use: Never used  Substance Use Topics   Alcohol use: No    Comment: occasional   Drug use: No     Allergies   Patient has no known allergies.   Review of Systems Review of Systems Per HPI  Physical Exam Triage Vital Signs ED Triage Vitals  Enc Vitals Group     BP 03/31/22 1604 115/76     Pulse Rate 03/31/22 1604 (!) 106  Resp 03/31/22 1604 18     Temp 03/31/22 1604 99.6 F (37.6 C)     Temp Source 03/31/22 1604 Oral     SpO2 03/31/22 1604 95 %     Weight --      Height --      Head Circumference --      Peak Flow --      Pain Score 03/31/22 1605 5     Pain Loc --      Pain Edu? --      Excl. in GC? --    No data found.  Updated Vital Signs BP 115/76 (BP Location: Left Arm)   Pulse (!) 106   Temp 99.6 F (37.6 C) (Oral)   Resp 18   SpO2 95%   Visual Acuity Right Eye Distance:   Left Eye Distance:   Bilateral Distance:    Right Eye Near:   Left Eye Near:    Bilateral Near:     Physical Exam Constitutional:      General: She is not in acute distress.    Appearance: Normal appearance. She is not toxic-appearing or diaphoretic.  HENT:     Head: Normocephalic and atraumatic.     Right Ear: Tympanic membrane and ear canal normal.     Left Ear: Tympanic membrane and ear  canal normal.     Nose: Congestion present.     Mouth/Throat:     Mouth: Mucous membranes are moist.     Pharynx: Posterior oropharyngeal erythema present.  Eyes:     Extraocular Movements: Extraocular movements intact.     Conjunctiva/sclera: Conjunctivae normal.     Pupils: Pupils are equal, round, and reactive to light.  Cardiovascular:     Rate and Rhythm: Normal rate and regular rhythm.     Pulses: Normal pulses.     Heart sounds: Normal heart sounds.  Pulmonary:     Effort: Pulmonary effort is normal. No respiratory distress.     Breath sounds: Normal breath sounds. No stridor. No wheezing, rhonchi or rales.  Abdominal:     General: Abdomen is flat. Bowel sounds are normal. There is no distension.     Palpations: Abdomen is soft.     Tenderness: There is no abdominal tenderness.  Musculoskeletal:        General: Normal range of motion.     Cervical back: Normal range of motion.  Skin:    General: Skin is warm and dry.  Neurological:     General: No focal deficit present.     Mental Status: She is alert and oriented to person, place, and time. Mental status is at baseline.  Psychiatric:        Mood and Affect: Mood normal.        Behavior: Behavior normal.        Thought Content: Thought content normal.        Judgment: Judgment normal.     UC Treatments / Results  Labs (all labs ordered are listed, but only abnormal results are displayed) Labs Reviewed  POCT URINALYSIS DIP (MANUAL ENTRY) - Abnormal; Notable for the following components:      Result Value   Clarity, UA cloudy (*)    Protein Ur, POC trace (*)    Leukocytes, UA Large (3+) (*)    All other components within normal limits  URINE CULTURE  CULTURE, GROUP A STREP ([facility])  NOVEL CORONAVIRUS, NAA  POCT RAPID STREP A (OFFICE)  POCT URINE PREGNANCY  EKG   Radiology No results found.  Procedures Procedures (including critical care time)  Medications Ordered in UC Medications - No data to  display  Initial Impression / Assessment and Plan / UC Course  I have reviewed the triage vital signs and the nursing notes.  Pertinent labs & imaging results that were available during my care of the patient were reviewed by me and considered in my medical decision making (see chart for details).     Patient presents with symptoms likely from a viral upper respiratory infection. Differential includes bacterial pneumonia, sinusitis, allergic rhinitis, COVID-19, flu. Do not suspect underlying cardiopulmonary process. Symptoms seem unlikely related to ACS, CHF or COPD exacerbations, pneumonia, pneumothorax. Patient is nontoxic appearing and not in need of emergent medical intervention.  Strep was negative.  Throat culture and COVID test pending. Recommended symptom control with over the counter medications.  Urinalysis indicating urinary tract infection.  Will treat with cefdinir antibiotic given that patient also has a sore throat and there is concern for strep as this will treat both UTI and strep throat.  Urine culture pending.  Return if symptoms fail to improve in 1-2 weeks or you develop shortness of breath, chest pain, severe headache. Patient states understanding and is agreeable.  Discharged with PCP followup.  Final Clinical Impressions(s) / UC Diagnoses   Final diagnoses:  Lower urinary tract infection  Dysuria  Sore throat  Viral upper respiratory tract infection with cough     Discharge Instructions      Rapid strep is negative.  Throat culture and COVID test are pending.  We will call if they are positive.  It appears that you may have a viral upper respiratory infection that should run its course and self resolve in the next few days with symptomatic treatment.  Your urine is showing signs of urinary tract infection.  You are being treated with an antibiotic to treat this.  Urine culture is pending.  Please follow-up if symptoms persist or worsen.    ED Prescriptions      Medication Sig Dispense Auth. Provider   cefdinir (OMNICEF) 300 MG capsule Take 1 capsule (300 mg total) by mouth 2 (two) times daily for 10 days. 20 capsule Teodora Medici, [place]      PDMP not reviewed this encounter.   Teodora Medici, [place] 03/31/22 1630

## 2022-04-01 LAB — NOVEL CORONAVIRUS, NAA: SARS-CoV-2, NAA: NOT DETECTED

## 2022-04-01 LAB — URINE CULTURE

## 2022-04-03 LAB — CULTURE, GROUP A STREP (THRC)

## 2022-05-13 ENCOUNTER — Other Ambulatory Visit (HOSPITAL_COMMUNITY)
Admission: RE | Admit: 2022-05-13 | Discharge: 2022-05-13 | Disposition: A | Discharge status: Home / Self Care | Payer: Medicaid Other | Source: Ambulatory Visit | Attending: Family Medicine | Admitting: Family Medicine

## 2022-05-13 ENCOUNTER — Encounter: Payer: Self-pay | Admitting: Family Medicine

## 2022-05-13 ENCOUNTER — Ambulatory Visit (INDEPENDENT_AMBULATORY_CARE_PROVIDER_SITE_OTHER): Payer: Medicaid Other | Admitting: Family Medicine

## 2022-05-13 VITALS — BP 118/79 | HR 91 | Ht 61.0 in | Wt 173.6 lb

## 2022-05-13 DIAGNOSIS — Z124 Encounter for screening for malignant neoplasm of cervix: Secondary | ICD-10-CM | POA: Diagnosis not present

## 2022-05-13 DIAGNOSIS — Z01419 Encounter for gynecological examination (general) (routine) without abnormal findings: Secondary | ICD-10-CM | POA: Insufficient documentation

## 2022-05-13 NOTE — Progress Notes (Signed)
Subjective:     Deanna Perez is a 26 y.o. female and is here for a comprehensive physical exam. The patient reports no problems. Has Nexplanon in since 12/2018--needs replacing, she wants to try Mirena.  The following portions of the patient's history were reviewed and updated as appropriate: allergies, current medications, past family history, past medical history, past social history, past surgical history, and problem list.  Review of Systems Pertinent items noted in HPI and remainder of comprehensive ROS otherwise negative.   Objective:    BP 118/79   Pulse 91   Ht 5\' 1"  (1.549 m)   Wt 173 lb 9.6 oz (78.7 kg)   BMI 32.80 kg/m  General appearance: alert, cooperative, and appears stated age Head: Normocephalic, without obvious abnormality, atraumatic Neck: no adenopathy, supple, symmetrical, trachea midline, and thyroid not enlarged, symmetric, no tenderness/mass/nodules Lungs: clear to auscultation bilaterally Heart: regular rate and rhythm, S1, S2 normal, no murmur, click, rub or gallop Abdomen: soft, non-tender; bowel sounds normal; no masses,  no organomegaly Pelvic: cervix normal in appearance, external genitalia normal, no adnexal masses or tenderness, no cervical motion tenderness, uterus normal size, shape, and consistency, and vagina normal without discharge Extremities: extremities normal, atraumatic, no cyanosis or edema Pulses: 2+ and symmetric Skin: Skin color, texture, turgor normal. No rashes or lesions Lymph nodes: Cervical, supraclavicular, and axillary nodes normal. Neurologic: Grossly normal    Assessment:    Healthy female exam.      Plan:  Well woman exam - Plan: Cytology - PAP( [place]), Cervicovaginal ancillary only( [place]), HIV Antibody (routine testing w rflx), RPR, Hepatitis B Surface AntiGEN, Hepatitis C Antibody  Screening for malignant neoplasm of cervix  Encounter for gynecological examination without abnormal finding  Return in  about 4 weeks (around 06/10/2022) for Nexplanon removal and IUD insertion.    See After Visit Summary for Counseling Recommendations

## 2022-05-13 NOTE — Progress Notes (Signed)
Patient presents for AEX.  Last Pap: 11/03/2017- normal, due today  Contraception: Nexplanon inserted Feb 2020  Vaginal/Urinary Symptoms: denies   STD Screen: Desires STD swab and blood work  Other Concerns: Has concerns about c-section scar tissue. Has pain, tightness near incision site with physical activity. C-section was 2018.

## 2022-05-13 NOTE — Patient Instructions (Signed)

## 2022-05-14 LAB — HEPATITIS C ANTIBODY: Hep C Virus Ab: NONREACTIVE

## 2022-05-14 LAB — HIV ANTIBODY (ROUTINE TESTING W REFLEX): HIV Screen 4th Generation wRfx: NONREACTIVE

## 2022-05-14 LAB — RPR: RPR Ser Ql: NONREACTIVE

## 2022-05-14 LAB — HEPATITIS B SURFACE ANTIGEN: Hepatitis B Surface Ag: NEGATIVE

## 2022-05-15 LAB — CYTOLOGY - PAP: Diagnosis: NEGATIVE

## 2022-05-15 LAB — CERVICOVAGINAL ANCILLARY ONLY
Chlamydia: NEGATIVE
Comment: NEGATIVE
Comment: NEGATIVE
Comment: NORMAL
Neisseria Gonorrhea: NEGATIVE
Trichomonas: NEGATIVE

## 2022-06-22 ENCOUNTER — Telehealth: Payer: Self-pay

## 2022-06-22 NOTE — Telephone Encounter (Signed)
Left message for pt to call office back regarding available appointment

## 2022-06-24 ENCOUNTER — Encounter: Payer: Self-pay | Admitting: Family Medicine

## 2022-06-24 ENCOUNTER — Ambulatory Visit (INDEPENDENT_AMBULATORY_CARE_PROVIDER_SITE_OTHER): Payer: Medicaid Other | Admitting: Family Medicine

## 2022-06-24 VITALS — BP 126/81 | HR 85 | Wt 172.0 lb

## 2022-06-24 DIAGNOSIS — Z3046 Encounter for surveillance of implantable subdermal contraceptive: Secondary | ICD-10-CM | POA: Diagnosis not present

## 2022-06-24 NOTE — Progress Notes (Signed)
     GYNECOLOGY OFFICE PROCEDURE NOTE  Deanna Perez is a 26 y.o. [phone] here for Nexplanon removal.   Last pap smear was on 05/13/2022 and was normal.  No other gynecologic concerns.  Nexplanon Removal Patient identified, informed consent performed, consent signed.   Appropriate time out taken. Nexplanon site identified.  Area prepped in usual sterile fashon. One ml of 1% lidocaine was used to anesthetize the area at the distal end of the implant. A small stab incision was made right beside the implant on the distal portion.  The Nexplanon rod was grasped using hemostats and removed without difficulty.  There was minimal blood loss. There were no complications.  3 ml of 1% lidocaine was injected around the incision for post-procedure analgesia.  Steri-strips were applied over the small incision.  A pressure bandage was applied to reduce any bruising.  The patient tolerated the procedure well and was given post procedure instructions.  Patient is planning to use nothing for contraception/attempt conception.  Reva Bores 06/24/2022 1:58 PM

## 2022-06-24 NOTE — Progress Notes (Signed)
Patient presents for Nexplanon removal.  Pt does not desire any contraception at this time.

## 2022-11-18 ENCOUNTER — Other Ambulatory Visit: Payer: Self-pay | Admitting: *Deleted

## 2022-11-18 DIAGNOSIS — B009 Herpesviral infection, unspecified: Secondary | ICD-10-CM

## 2022-11-18 MED ORDER — VALACYCLOVIR HCL 1 G PO TABS: 1000.0000 mg | ORAL_TABLET | Freq: Every day | ORAL | 5 refills | Status: DC

## 2022-11-19 DIAGNOSIS — Z0000 Encounter for general adult medical examination without abnormal findings: Secondary | ICD-10-CM | POA: Diagnosis not present

## 2022-11-19 DIAGNOSIS — E6609 Other obesity due to excess calories: Secondary | ICD-10-CM | POA: Diagnosis not present

## 2022-11-19 DIAGNOSIS — R300 Dysuria: Secondary | ICD-10-CM | POA: Diagnosis not present

## 2022-11-19 DIAGNOSIS — E538 Deficiency of other specified B group vitamins: Secondary | ICD-10-CM | POA: Diagnosis not present

## 2022-11-19 DIAGNOSIS — D649 Anemia, unspecified: Secondary | ICD-10-CM | POA: Diagnosis not present

## 2022-11-19 DIAGNOSIS — Z13220 Encounter for screening for lipoid disorders: Secondary | ICD-10-CM | POA: Diagnosis not present

## 2022-11-19 DIAGNOSIS — Z6830 Body mass index (BMI) 30.0-30.9, adult: Secondary | ICD-10-CM | POA: Diagnosis not present

## 2022-11-26 DIAGNOSIS — E538 Deficiency of other specified B group vitamins: Secondary | ICD-10-CM | POA: Diagnosis not present

## 2023-05-27 ENCOUNTER — Inpatient Hospital Stay (HOSPITAL_COMMUNITY): Payer: BC Managed Care – PPO

## 2023-05-27 ENCOUNTER — Encounter (HOSPITAL_COMMUNITY): Payer: Self-pay | Admitting: Obstetrics and Gynecology

## 2023-05-27 ENCOUNTER — Inpatient Hospital Stay (HOSPITAL_COMMUNITY)
Admission: AD | Admit: 2023-05-27 | Discharge: 2023-05-27 | Disposition: A | Discharge status: Home / Self Care | Payer: BC Managed Care – PPO | Attending: Obstetrics and Gynecology | Admitting: Obstetrics and Gynecology

## 2023-05-27 DIAGNOSIS — O26891 Other specified pregnancy related conditions, first trimester: Secondary | ICD-10-CM | POA: Diagnosis not present

## 2023-05-27 DIAGNOSIS — R102 Pelvic and perineal pain: Secondary | ICD-10-CM | POA: Diagnosis not present

## 2023-05-27 DIAGNOSIS — O26899 Other specified pregnancy related conditions, unspecified trimester: Secondary | ICD-10-CM | POA: Insufficient documentation

## 2023-05-27 DIAGNOSIS — Z3A00 Weeks of gestation of pregnancy not specified: Secondary | ICD-10-CM | POA: Diagnosis not present

## 2023-05-27 DIAGNOSIS — O209 Hemorrhage in early pregnancy, unspecified: Secondary | ICD-10-CM

## 2023-05-27 DIAGNOSIS — R109 Unspecified abdominal pain: Secondary | ICD-10-CM

## 2023-05-27 DIAGNOSIS — Z640 Problems related to unwanted pregnancy: Secondary | ICD-10-CM | POA: Diagnosis not present

## 2023-05-27 DIAGNOSIS — Z3A01 Less than 8 weeks gestation of pregnancy: Secondary | ICD-10-CM

## 2023-05-27 DIAGNOSIS — O3680X0 Pregnancy with inconclusive fetal viability, not applicable or unspecified: Secondary | ICD-10-CM | POA: Diagnosis not present

## 2023-05-27 LAB — URINALYSIS, ROUTINE W REFLEX MICROSCOPIC
Bilirubin Urine: NEGATIVE
Glucose, UA: NEGATIVE mg/dL
Hgb urine dipstick: NEGATIVE
Ketones, ur: NEGATIVE mg/dL
Nitrite: NEGATIVE
Protein, ur: 30 mg/dL — AB
Specific Gravity, Urine: 1.026 (ref 1.005–1.030)
pH: 5 (ref 5.0–8.0)

## 2023-05-27 LAB — COMPREHENSIVE METABOLIC PANEL
ALT: 30 U/L (ref 0–44)
AST: 19 U/L (ref 15–41)
Albumin: 3.8 g/dL (ref 3.5–5.0)
Alkaline Phosphatase: 78 U/L (ref 38–126)
Anion gap: 9 (ref 5–15)
BUN: 13 mg/dL (ref 6–20)
CO2: 23 mmol/L (ref 22–32)
Calcium: 9.2 mg/dL (ref 8.9–10.3)
Chloride: 102 mmol/L (ref 98–111)
Creatinine, Ser: 0.96 mg/dL (ref 0.44–1.00)
GFR, Estimated: >60 mL/min (ref >60)
Glucose, Bld: 107 mg/dL — ABNORMAL HIGH (ref 70–99)
Potassium: 3.9 mmol/L (ref 3.5–5.1)
Sodium: 134 mmol/L — ABNORMAL LOW (ref 135–145)
Total Bilirubin: 0.3 mg/dL (ref 0.3–1.2)
Total Protein: 7.7 g/dL (ref 6.5–8.1)

## 2023-05-27 LAB — CBC
HCT: 35.8 % — ABNORMAL LOW (ref 36.0–46.0)
Hemoglobin: 11.4 g/dL — ABNORMAL LOW (ref 12.0–15.0)
MCH: 27 pg (ref 26.0–34.0)
MCHC: 31.8 g/dL (ref 30.0–36.0)
MCV: 84.8 fL (ref 80.0–100.0)
Platelets: 273 10*3/uL (ref 150–400)
RBC: 4.22 MIL/uL (ref 3.87–5.11)
RDW: 12.7 % (ref 11.5–15.5)
WBC: 6.6 10*3/uL (ref 4.0–10.5)
nRBC: 0 % (ref 0.0–0.2)

## 2023-05-27 LAB — WET PREP, GENITAL
Sperm: NONE SEEN
Trich, Wet Prep: NONE SEEN
WBC, Wet Prep HPF POC: >=10 — AB (ref <10)
Yeast Wet Prep HPF POC: NONE SEEN

## 2023-05-27 LAB — HCG, QUANTITATIVE, PREGNANCY: hCG, Beta Chain, Quant, S: 995 m[IU]/mL — ABNORMAL HIGH (ref <5)

## 2023-05-27 LAB — POCT PREGNANCY, URINE: Preg Test, Ur: POSITIVE — AB

## 2023-05-27 LAB — HIV ANTIBODY (ROUTINE TESTING W REFLEX): HIV Screen 4th Generation wRfx: NONREACTIVE

## 2023-05-27 NOTE — MAU Note (Signed)
.  Deanna Perez is a 27 y.o. at Unknown here in MAU reporting 3 positive upts. Went to [facility] choice today to begin process of termination. U/S done today and nothing was seen in the uterus. Was told to come here to r/p ectopic. Cramping for 3 days. Had scant bright bleeding yesterday but none today.  LMP: 04/12/23 Onset of complaint: 3days Pain score: 5 Vitals:   05/27/23 2003 05/27/23 2006  BP:  125/77  Pulse: 80   Resp: 17   Temp: 97.9 F (36.6 C)   SpO2: 100%      FHT:n/a Lab orders placed from triage:

## 2023-05-27 NOTE — MAU Provider Note (Signed)
Chief Complaint: Abdominal Pain   Event Date/Time   First Provider Initiated Contact with Patient 05/27/23 2142        SUBJECTIVE HPI: Deanna Perez is a 27 y.o. G3P2002 at Unknown by LMP who presents to maternity admissions reporting positive pregnancy test, lower abdominal cramping.  Went to terminate pregnancy and was told they could not see pregnancy in uterus and she needed to come here to rule it out. . She denies vaginal bleeding, vaginal itching/burning, urinary symptoms, h/a, dizziness, n/v, or fever/chills.     Abdominal Pain This is a new problem. The current episode started in the past 7 days. The pain is located in the suprapubic region. Pertinent negatives include no anorexia, constipation, diarrhea, dysuria, fever or frequency. Nothing aggravates the pain. The pain is relieved by Nothing. She has tried nothing for the symptoms.   RN Note: Deanna Perez is a 27 y.o. at Unknown here in MAU reporting 3 positive upts. Went to [facility] choice today to begin process of termination. U/S done today and nothing was seen in the uterus. Was told to come here to r/p ectopic. Cramping for 3 days. Had scant bright bleeding yesterday but none today.  LMP: 04/12/23 Onset of complaint: 3days Pain score: 5  Past Medical History:  Diagnosis Date   Cholestasis during pregnancy in third trimester 09/27/2017   Gonorrhea    Herpes genitalis in women 11/03/2017   Past Surgical History:  Procedure Laterality Date   CESAREAN SECTION N/A 09/28/2017   Procedure: CESAREAN SECTION;  Surgeon: Levie Heritage, DO;  Location: [facility];  Service: Obstetrics;  Laterality: N/A;   Social History   Socioeconomic History   Marital status: Single    Spouse name: Not on file   Number of children: Not on file   Years of education: Not on file   Highest education level: Not on file  Occupational History    Employer: [employer]    Comment: cutsomer service  Tobacco Use   Smoking status:  Never   Smokeless tobacco: Never  Vaping Use   Vaping status: Never Used  Substance and Sexual Activity   Alcohol use: No    Comment: occasional   Drug use: No   Sexual activity: Yes    Birth control/protection: Implant  Other Topics Concern   Not on file  Social History Narrative   Not on file   Social Determinants of Health   Financial Resource Strain: Low Risk  (11/01/2018)   Overall Financial Resource Strain (CARDIA)    Difficulty of Paying Living Expenses: Not hard at all  Food Insecurity: No Food Insecurity (11/01/2018)   Hunger Vital Sign    Worried About Running Out of Food in the Last Year: Never true    Ran Out of Food in the Last Year: Never true  Transportation Needs: Unknown (11/01/2018)   PRAPARE - Administrator, Civil Service (Medical): No    Lack of Transportation (Non-Medical): Not on file  Physical Activity: Not on file  Stress: No Stress Concern Present (11/01/2018)   [employer] of Occupational Health - Occupational Stress Questionnaire    Feeling of Stress : Only a little  Social Connections: Not on file  Intimate Partner Violence: Low Risk  (11/15/2020)   Received from [facility]   Intimate Partner Violence    Insults You: Not on file    Threatens You: Not on file    Screams at You: Not on file  Physically Hurt: Not on file    Intimate Partner Violence Score: Not on file   No current facility-administered medications on file prior to encounter.   Current Outpatient Medications on File Prior to Encounter  Medication Sig Dispense Refill   etonogestrel (NEXPLANON) 68 MG IMPL implant 1 each by Subdermal route once.     valACYclovir (VALTREX) 1000 MG tablet Take 1 tablet (1,000 mg total) by mouth daily. (Patient not taking: Reported on 05/27/2023) 60 tablet 5   No Known Allergies  I have reviewed patient's Past Medical Hx, Surgical Hx, Family Hx, Social Hx, medications and allergies.   ROS:  Review of Systems   Constitutional:  Negative for fever.  Gastrointestinal:  Positive for abdominal pain. Negative for anorexia, constipation and diarrhea.  Genitourinary:  Negative for dysuria and frequency.   Review of Systems  Other systems negative   Physical Exam  Physical Exam Patient Vitals for the past 24 hrs:  BP Temp Pulse Resp SpO2 Height Weight  05/27/23 2006 125/77 -- -- -- -- -- --  05/27/23 2003 -- 97.9 F (36.6 C) 80 17 100 % 5\' 1"  (1.549 m) 75.8 kg   Constitutional: Well-developed, well-nourished female in no acute distress.  Cardiovascular: normal rate Respiratory: normal effort GI: Abd soft, non-tender.  MS: Extremities nontender, no edema, normal ROM Neurologic: Alert and oriented x 4.  GU: Neg CVAT.  PELVIC EXAM: deferred in lieu of ultrasound  LAB RESULTS Results for orders placed or performed during the hospital encounter of 05/27/23 (from the past 24 hour(s))  CBC     Status: Abnormal   Collection Time: 05/27/23  8:34 PM  Result Value Ref Range   WBC 6.6 4.0 - 10.5 K/uL   RBC 4.22 3.87 - 5.11 MIL/uL   Hemoglobin 11.4 (L) 12.0 - 15.0 g/dL   HCT 16.1 (L) 09.6 - 04.5 %   MCV 84.8 80.0 - 100.0 fL   MCH 27.0 26.0 - 34.0 pg   MCHC 31.8 30.0 - 36.0 g/dL   RDW 40.9 81.1 - 91.4 %   Platelets 273 150 - 400 K/uL   nRBC 0.0 0.0 - 0.2 %  Comprehensive metabolic panel     Status: Abnormal   Collection Time: 05/27/23  8:34 PM  Result Value Ref Range   Sodium 134 (L) 135 - 145 mmol/L   Potassium 3.9 3.5 - 5.1 mmol/L   Chloride 102 98 - 111 mmol/L   CO2 23 22 - 32 mmol/L   Glucose, Bld 107 (H) 70 - 99 mg/dL   BUN 13 6 - 20 mg/dL   Creatinine, Ser 7.82 0.44 - 1.00 mg/dL   Calcium 9.2 8.9 - 95.6 mg/dL   Total Protein 7.7 6.5 - 8.1 g/dL   Albumin 3.8 3.5 - 5.0 g/dL   AST 19 15 - 41 U/L   ALT 30 0 - 44 U/L   Alkaline Phosphatase 78 38 - 126 U/L   Total Bilirubin 0.3 0.3 - 1.2 mg/dL   GFR, Estimated >21 >30 mL/min   Anion gap 9 5 - 15  hCG, quantitative, pregnancy      Status: Abnormal   Collection Time: 05/27/23  8:34 PM  Result Value Ref Range   hCG, Beta Chain, Quant, S 995 (H) <5 mIU/mL  Urinalysis, Routine w reflex microscopic -Urine, Clean Catch     Status: Abnormal   Collection Time: 05/27/23  8:49 PM  Result Value Ref Range   Color, Urine YELLOW YELLOW   APPearance HAZY (A) CLEAR  Specific Gravity, Urine 1.026 1.005 - 1.030   pH 5.0 5.0 - 8.0   Glucose, UA NEGATIVE NEGATIVE mg/dL   Hgb urine dipstick NEGATIVE NEGATIVE   Bilirubin Urine NEGATIVE NEGATIVE   Ketones, ur NEGATIVE NEGATIVE mg/dL   Protein, ur 30 (A) NEGATIVE mg/dL   Nitrite NEGATIVE NEGATIVE   Leukocytes,Ua TRACE (A) NEGATIVE   RBC / HPF 0-5 0 - 5 RBC/hpf   WBC, UA 11-20 0 - 5 WBC/hpf   Bacteria, UA RARE (A) NONE SEEN   Squamous Epithelial / HPF 6-10 0 - 5 /HPF   Mucus PRESENT   Pregnancy, urine POC     Status: Abnormal   Collection Time: 05/27/23  8:51 PM  Result Value Ref Range   Preg Test, Ur POSITIVE (A) NEGATIVE  Wet prep, genital     Status: Abnormal   Collection Time: 05/27/23  9:19 PM  Result Value Ref Range   Yeast Wet Prep HPF POC NONE SEEN NONE SEEN   Trich, Wet Prep NONE SEEN NONE SEEN   Clue Cells Wet Prep HPF POC PRESENT (A) NONE SEEN   WBC, Wet Prep HPF POC >=10 (A) <10   Sperm NONE SEEN        IMAGING US OB LESS THAN 14 WEEKS WITH OB TRANSVAGINAL  Result Date: 05/27/2023 CLINICAL DATA:  Cramping.  Pregnancy of unknown anatomic location EXAM: OBSTETRIC <14 WK [place] AND TRANSVAGINAL OB US TECHNIQUE: Both transabdominal and transvaginal ultrasound examinations were performed for complete evaluation of the gestation as well as the maternal uterus, adnexal regions, and pelvic cul-de-sac. Transvaginal technique was performed to assess early pregnancy. COMPARISON:  None Available. FINDINGS: Intrauterine gestational sac: None Yolk sac:  Not Visualized. Embryo:  Not Visualized. Cardiac Activity: Not Visualized. Heart Rate:   bpm MSD:   mm    w     d CRL:    mm    w     d                  [place] EDC: Subchorionic hemorrhage:  None visualized. Maternal uterus/adnexae: No adnexal mass or free fluid. IMPRESSION: No intrauterine pregnancy visualized. Differential considerations would include early intrauterine pregnancy too early to visualize, spontaneous abortion, or occult ectopic pregnancy. Recommend close clinical followup and serial quantitative beta HCGs and ultrasounds. Electronically Signed   By: Charlett Nose M.D.   On: 05/27/2023 22:33     MAU Management/MDM: I have reviewed the triage vital signs and the nursing notes.   Pertinent labs & imaging results that were available during my care of the patient were reviewed by me and considered in my medical decision making (see chart for details).      I have reviewed her medical records including past results, notes and treatments. Medical, Surgical, and family history were reviewed.  Medications and recent lab tests were reviewed  Ordered usual first trimester r/o ectopic labs.   Will check baseline Ultrasound to rule out ectopic.  This bleeding/pain can represent a normal pregnancy with bleeding, spontaneous abortion or even an ectopic which can be life-threatening.  The process as listed above helps to determine which of these is present.  Reviewed nothing seen on [place] which is not surprising given HCG level   Recommend repeating HCG in 48 hours then repeating US if rises    ASSESSMENT Pregnancy at Unknown by LMP Pelvic pain Pregnancy of unknown location Undesired pregnancy  PLAN Discharge home Plan to repeat HCG level in 48 hours  Will  repeat  Ultrasound in about 7-10 days if HCG levels double appropriately  Ectopic precautions  Pt stable at time of discharge. Encouraged to return here if she develops worsening of symptoms, increase in pain, fever, or other concerning symptoms.    Wynelle Bourgeois CNM, MSN Certified Nurse-Midwife 05/27/2023  9:42 PM

## 2023-05-28 ENCOUNTER — Telehealth (HOSPITAL_COMMUNITY): Payer: Self-pay

## 2023-05-28 MED ORDER — AZITHROMYCIN 500 MG PO TABS: 1000.0000 mg | ORAL_TABLET | Freq: Once | ORAL | 0 refills | Status: AC

## 2023-05-30 ENCOUNTER — Encounter (HOSPITAL_COMMUNITY): Payer: Self-pay | Admitting: *Deleted

## 2023-05-30 ENCOUNTER — Telehealth: Payer: Self-pay | Admitting: Obstetrics and Gynecology

## 2023-05-30 ENCOUNTER — Inpatient Hospital Stay (HOSPITAL_COMMUNITY)
Admission: AD | Admit: 2023-05-30 | Discharge: 2023-05-30 | Disposition: A | Discharge status: Home / Self Care | Payer: BC Managed Care – PPO | Attending: Family Medicine | Admitting: Family Medicine

## 2023-05-30 DIAGNOSIS — R7989 Other specified abnormal findings of blood chemistry: Secondary | ICD-10-CM

## 2023-05-30 DIAGNOSIS — Z3A01 Less than 8 weeks gestation of pregnancy: Secondary | ICD-10-CM | POA: Diagnosis not present

## 2023-05-30 DIAGNOSIS — O26891 Other specified pregnancy related conditions, first trimester: Secondary | ICD-10-CM | POA: Diagnosis present

## 2023-05-30 DIAGNOSIS — O3680X0 Pregnancy with inconclusive fetal viability, not applicable or unspecified: Secondary | ICD-10-CM

## 2023-05-30 LAB — HCG, QUANTITATIVE, PREGNANCY: hCG, Beta Chain, Quant, S: 3632 m[IU]/mL — ABNORMAL HIGH (ref <5)

## 2023-05-30 NOTE — MAU Note (Signed)
Deanna Perez is a 27 y.o. at [redacted]w[redacted]d here in MAU reporting: here for repeat blood work.  Bleeding stopped after her visit.  Is feeling ok. Little cramping, not as bad as it was.  Onset of complaint: ongoing Pain score: mild Vitals:   05/30/23 1341  BP: 114/68  Resp: 16  Temp: 98.2 F (36.8 C)  SpO2: 99%      Lab orders placed from triage:

## 2023-05-30 NOTE — Telephone Encounter (Signed)
Patient ID verified using 2 identifiers. TC to notify patient of appropriately rising HCG levels. Viability U/S scheduled for 06/10/2023 @ 0815. Advised to arrive at 0800 and plan to be there for at least 1 hour to allow for time to speak to a provider about U/S results and POC afterward. Patient verbalized an understanding of the plan of care and agrees.   Raelyn Mora, CNM  05/30/2023 3:47 PM

## 2023-05-30 NOTE — MAU Provider Note (Signed)
History   Chief Complaint:  Follow-up   Deanna Perez is  27 y.o. [id number] Patient's last menstrual period was 04/12/2023.Marland Kitchen Patient is here for follow up of quantitative HCG and ongoing surveillance of pregnancy status. She is [redacted]w[redacted]d weeks gestation by LMP.    Since her last visit, the patient is without new complaint. The patient reports bleeding as  none now.  She reports mild cramping, but not like it was the last time when she was here..  General ROS:  negative  Her previous Quantitative HCG values are:  Recent Labs  Lab 05/30/23 1401  [id number]*    Physical Exam   Blood pressure 114/68, temperature 98.2 F (36.8 C), temperature source Oral, resp. rate 16, height 5\' 1"  (1.549 m), weight 74.7 kg, last menstrual period 04/12/2023, SpO2 99%.  Focused Gynecological Exam: examination not indicated  Labs: Results for orders placed or performed during the hospital encounter of 05/30/23 (from the past 24 hour(s))  hCG, quantitative, pregnancy   Collection Time: 05/30/23  2:01 PM  Result Value Ref Range   hCG, Beta Chain, Quant, S 3,632 (H) <5 mIU/mL    Ultrasound Studies:   US OB LESS THAN 14 WEEKS WITH OB TRANSVAGINAL  Result Date: 05/27/2023 CLINICAL DATA:  Cramping.  Pregnancy of unknown anatomic location EXAM: OBSTETRIC <14 WK [place] AND TRANSVAGINAL OB US TECHNIQUE: Both transabdominal and transvaginal ultrasound examinations were performed for complete evaluation of the gestation as well as the maternal uterus, adnexal regions, and pelvic cul-de-sac. Transvaginal technique was performed to assess early pregnancy. COMPARISON:  None Available. FINDINGS: Intrauterine gestational sac: None Yolk sac:  Not Visualized. Embryo:  Not Visualized. Cardiac Activity: Not Visualized. Heart Rate:   bpm MSD:   mm    w     d CRL:    mm    w    d                  [place] EDC: Subchorionic hemorrhage:  None visualized. Maternal uterus/adnexae: No adnexal mass or free fluid. IMPRESSION: No  intrauterine pregnancy visualized. Differential considerations would include early intrauterine pregnancy too early to visualize, spontaneous abortion, or occult ectopic pregnancy. Recommend close clinical followup and serial quantitative beta HCGs and ultrasounds. Electronically Signed   By: Charlett Nose M.D.   On: 05/27/2023 22:33    Assessment:   1. Elevated serum hCG   2. Pregnancy of unknown anatomic location   3. [redacted] weeks gestation of pregnancy      Plan: -Discharge home in stable condition -Ectopic precautions discussed -Patient advised to follow-up with [facility] for viability U/S -Patient verbalizes desire for TOP. Advised that IUP needs to be determined before any facility will perform TOP -Patient may return to MAU as needed or if her condition were to change or worsen  Raelyn Mora, CNM 05/30/2023, 2:03 PM

## 2023-06-01 ENCOUNTER — Other Ambulatory Visit: Payer: Self-pay | Admitting: Advanced Practice Midwife

## 2023-06-01 NOTE — Progress Notes (Signed)
Chlamydia positive  I called patient to make sure she got treated She states she did get treated and we reviewed test of cure in 3 weeks  Aviva Signs, CNM

## 2023-06-10 ENCOUNTER — Other Ambulatory Visit (INDEPENDENT_AMBULATORY_CARE_PROVIDER_SITE_OTHER): Payer: BC Managed Care – PPO

## 2023-06-10 DIAGNOSIS — Z3200 Encounter for pregnancy test, result unknown: Secondary | ICD-10-CM

## 2023-06-10 DIAGNOSIS — Z3A01 Less than 8 weeks gestation of pregnancy: Secondary | ICD-10-CM

## 2023-06-10 DIAGNOSIS — O3680X0 Pregnancy with inconclusive fetal viability, not applicable or unspecified: Secondary | ICD-10-CM

## 2023-06-10 DIAGNOSIS — Z3481 Encounter for supervision of other normal pregnancy, first trimester: Secondary | ICD-10-CM | POA: Diagnosis not present

## 2023-11-30 DIAGNOSIS — Z113 Encounter for screening for infections with a predominantly sexual mode of transmission: Secondary | ICD-10-CM | POA: Diagnosis not present

## 2023-11-30 DIAGNOSIS — E66811 Obesity, class 1: Secondary | ICD-10-CM | POA: Diagnosis not present

## 2023-11-30 DIAGNOSIS — D500 Iron deficiency anemia secondary to blood loss (chronic): Secondary | ICD-10-CM | POA: Diagnosis not present

## 2023-11-30 DIAGNOSIS — E6609 Other obesity due to excess calories: Secondary | ICD-10-CM | POA: Diagnosis not present

## 2023-11-30 DIAGNOSIS — Z0000 Encounter for general adult medical examination without abnormal findings: Secondary | ICD-10-CM | POA: Diagnosis not present

## 2023-11-30 DIAGNOSIS — D649 Anemia, unspecified: Secondary | ICD-10-CM | POA: Diagnosis not present

## 2023-11-30 DIAGNOSIS — E538 Deficiency of other specified B group vitamins: Secondary | ICD-10-CM | POA: Diagnosis not present

## 2023-11-30 DIAGNOSIS — Z114 Encounter for screening for human immunodeficiency virus [HIV]: Secondary | ICD-10-CM | POA: Diagnosis not present

## 2023-11-30 DIAGNOSIS — Z6830 Body mass index (BMI) 30.0-30.9, adult: Secondary | ICD-10-CM | POA: Diagnosis not present

## 2023-12-16 DIAGNOSIS — R6889 Other general symptoms and signs: Secondary | ICD-10-CM | POA: Diagnosis not present

## 2024-01-05 DIAGNOSIS — A749 Chlamydial infection, unspecified: Secondary | ICD-10-CM | POA: Diagnosis not present

## 2024-01-05 DIAGNOSIS — F411 Generalized anxiety disorder: Secondary | ICD-10-CM | POA: Diagnosis not present

## 2024-01-06 DIAGNOSIS — A749 Chlamydial infection, unspecified: Secondary | ICD-10-CM | POA: Diagnosis not present

## 2024-04-14 DIAGNOSIS — Z202 Contact with and (suspected) exposure to infections with a predominantly sexual mode of transmission: Secondary | ICD-10-CM | POA: Diagnosis not present

## 2024-04-14 DIAGNOSIS — N760 Acute vaginitis: Secondary | ICD-10-CM | POA: Diagnosis not present

## 2024-05-07 ENCOUNTER — Inpatient Hospital Stay (HOSPITAL_COMMUNITY)
Admission: AD | Admit: 2024-05-07 | Discharge: 2024-05-07 | Disposition: A | Discharge status: Home / Self Care | Attending: Obstetrics & Gynecology | Admitting: Obstetrics & Gynecology

## 2024-05-07 ENCOUNTER — Inpatient Hospital Stay (HOSPITAL_COMMUNITY)

## 2024-05-07 ENCOUNTER — Encounter (HOSPITAL_COMMUNITY): Payer: Self-pay | Admitting: Obstetrics & Gynecology

## 2024-05-07 DIAGNOSIS — O034 Incomplete spontaneous abortion without complication: Secondary | ICD-10-CM | POA: Diagnosis not present

## 2024-05-07 DIAGNOSIS — Z3A08 8 weeks gestation of pregnancy: Secondary | ICD-10-CM | POA: Diagnosis not present

## 2024-05-07 DIAGNOSIS — O208 Other hemorrhage in early pregnancy: Secondary | ICD-10-CM | POA: Diagnosis not present

## 2024-05-07 LAB — CBC
HCT: 25.9 % — ABNORMAL LOW (ref 36.0–46.0)
Hemoglobin: 8.5 g/dL — ABNORMAL LOW (ref 12.0–15.0)
MCH: 27.6 pg (ref 26.0–34.0)
MCHC: 32.8 g/dL (ref 30.0–36.0)
MCV: 84.1 fL (ref 80.0–100.0)
Platelets: 226 K/uL (ref 150–400)
RBC: 3.08 MIL/uL — ABNORMAL LOW (ref 3.87–5.11)
RDW: 13.2 % (ref 11.5–15.5)
WBC: 7.6 K/uL (ref 4.0–10.5)
nRBC: 0 % (ref 0.0–0.2)

## 2024-05-07 LAB — HCG, QUANTITATIVE, PREGNANCY: hCG, Beta Chain, Quant, S: 54520 m[IU]/mL — ABNORMAL HIGH (ref <5)

## 2024-05-07 MED ORDER — MISOPROSTOL 200 MCG PO TABS
800.0000 ug | ORAL_TABLET | Freq: Once | ORAL | Status: AC
  Administered 2024-05-07: 800 ug via ORAL
  Filled 2024-05-07: qty 4

## 2024-05-07 MED ORDER — OXYCODONE HCL 5 MG PO TABS
5.0000 mg | ORAL_TABLET | Freq: Once | ORAL | Status: AC
  Administered 2024-05-07: 5 mg via ORAL
  Filled 2024-05-07: qty 1

## 2024-05-07 MED ORDER — OXYCODONE-ACETAMINOPHEN 5-325 MG PO TABS
1.0000 | ORAL_TABLET | Freq: Four times a day (QID) | ORAL | 0 refills | Status: DC | PRN
Start: 2024-05-07 — End: 2024-05-23

## 2024-05-07 MED ORDER — ONDANSETRON HCL 4 MG PO TABS
4.0000 mg | ORAL_TABLET | Freq: Once | ORAL | Status: AC
  Administered 2024-05-07: 4 mg via ORAL
  Filled 2024-05-07: qty 1

## 2024-05-07 MED ORDER — IBUPROFEN 600 MG PO TABS: 600.0000 mg | ORAL_TABLET | Freq: Four times a day (QID) | ORAL | 3 refills | Status: DC | PRN

## 2024-05-07 MED ORDER — ONDANSETRON HCL 4 MG PO TABS: 4.0000 mg | ORAL_TABLET | Freq: Three times a day (TID) | ORAL | 0 refills | Status: AC | PRN

## 2024-05-07 MED ORDER — MISOPROSTOL 200 MCG PO TABS: 800.0000 ug | ORAL_TABLET | Freq: Once | ORAL | 0 refills | Status: DC

## 2024-05-07 NOTE — Discharge Instructions (Addendum)
 You should receive a phone call from the office to schedule a follow up appointment!  Reasons to return to MAU at [facility]: If you have heavier bleeding that soaks through more that 2 pads per hour for an hour or more  If you bleed so much that you feel like you might pass out or you do pass out If you have significant abdominal pain that is not improved with Tylenol  1000 mg every 8 hours as needed for pain If you develop a fever > 100.5   LOW HEMOGLOBIN: I would recommend you try a supplement called Blood Builder in addition to your prenatal vitamin. It is available via [employer] and also at stores like [employer]. It uses plant based iron  and is less prone to causing stomach and bowel upset. Here is a screen shot of the supplement. Alternatively, you can start a regular iron  supplement. Either ferrous sulfate , ferrous gluconate , or ferrous fumarate. The recommendation is 100 mg every other day. These can all be found over-the-counter.  For some people this can be constipating, so you can also add a daily stool softener (Miralax) if needed.  About 10% of people will have difficulty with iron  and it will cause GI upset. To reduce the chance of GI upset, take the supplement with food. Taking it at night so you sleep through potential side effects can also be helpful. If that does not help, taking a liquid formulation with food can help cause less GI symptoms.   CYTOTEC  MANAGEMENT Prostaglandins (cytotec ) are the most widely used drug for this purpose. They cause the uterus to cramp and contract. You will place the medicine yourself in the privacy of your home. Emptying of the uterus should occur within 3 days but the process may continue for several weeks. The bleeding may seem heavy at times.  I just sent the medication cytotec  to the pharmacy. You will take 800mcg (4 pills) orally, they do not need to dissolve. This will facilitate passage of the pregnancy. You can  repeat this dose tomorrow morning. Take the Zofran  about 30 minutes before to prevent nausea/vomiting.  Start taking ibuprofen  600 mg every 6 hours. You may also take the Percocet for severe pain not controlled by the ibuprofen .  Risk/Side effects: - Cramping and contraction like pain. This is related to the passage of the pregnancy. You can take the ibuprofen  or oxycodone  that I sent to the pharmacy. Ibuprofen  is especially helpful for cramping pain. - Bleeding which is expected and usually not severe. - Mild nausea or GI upset. I have sent in a medication called Zofran  in case you have this side effect - It might not work which would require a procedure.  You would seek care at the hospital for - Dizziness, lightheadedness - Severe abdominal pain - Fill 1 pad per hour of blood for 2 or more hours - Fever or chills - any other concern

## 2024-05-07 NOTE — MAU Note (Signed)
 Deanna Perez is a 28 y.o. at Unknown here in MAU reporting: took abortion pills last Saturday and Sunday. Had mild cramping and bleeding on Sunday. Felt fine Monday-Wednesday but cramping and bleeding picked up again on Thursday. Pt stated the cramping and bleeding increased last night and this am and has big heavy clots. Reports changing her pad every 23mins-1 hr and the pads have been full. Pt has pictures of clots on phone and clots appear to be larger than a golf ball.   Reporting nausea and dizzy episode in the shower today.   Onset of complaint: last night and this am  Pain score: 8 cramping   took ibuprofen  800mg  at 1245 and tylenol  650mg  at 1400 Vitals:   05/07/24 1416  BP: 115/68  Pulse: 92  Resp: 16  Temp: 98.4 F (36.9 C)  SpO2: 100%

## 2024-05-07 NOTE — MAU Provider Note (Signed)
 Chief Complaint:  Abdominal Pain and Vaginal Bleeding   HPI   Event Date/Time   First Provider Initiated Contact with Patient 05/07/24 1441      Deanna Perez is a 28 y.o. [id number] at Unknown who presents to maternity admissions reporting heavy vaginal bleeding and clots.  She found that she was pregnant and [facility] with [facility]/blood work and ultrasound about 3 weeks ago.  She took abortion pills last weekend, the first dose on 04/29/2024 and the second on 04/30/2024.  She endorses mild cramping and moderate bleeding on 04/30/2024.  She was pain-free with no vaginal bleeding from 05/01/2024 through 05/03/2024.  Starting on 04/04/2024, cramping and bleeding started again.  Cramping became significant and bleeding worsened last night.  Today, she has been passing large blood clots as large as her fist.  She is changing her pad every 30-60 minutes fully saturated.  She shared photos of the clots which range from golf ball to tennis ball size.  She also endorses nausea and dizziness while in the shower today.  Abdominal pain is currently 8/10.  She took ibuprofen  800 mg at 1245 and Tylenol  either 650 mg or 1000 mg at 1400 with minimal relief.   Pregnancy Course: She is not currently established with OBGYN. She was evaluated for pregnancy at [facility] about 3 weeks ago.  Past Medical History:  Diagnosis Date   Cholestasis during pregnancy in third trimester 09/27/2017   Gonorrhea    Herpes genitalis in women 11/03/2017   OB History  Gravida Para Term Preterm AB Living  3 2 2  0 0 2  SAB IAB Ectopic Multiple Live Births  0 0 0 0 2    # Outcome Date GA Lbr Len/2nd Weight Sex Type Anes PTL Lv  3 Gravida           2 Term 11/08/18 [redacted]w[redacted]d 18:35 / 01:13 3205 g M VBAC EPI  LIV  1 Term 09/28/17 [redacted]w[redacted]d  2715 g F CS-LTranv Spinal  LIV     Complications: HSV-2 infection   Past Surgical History:  Procedure Laterality Date   CESAREAN SECTION N/A 09/28/2017    Procedure: CESAREAN SECTION;  Surgeon: Barbra Lang PARAS, DO;  Location: [facility];  Service: Obstetrics;  Laterality: N/A;   Family History  Problem Relation Age of Onset   Hypertension Mother    Diabetes Father    Hypertension Father    Kidney disease Maternal Grandmother    Hypertension Paternal Grandmother    Diabetes Paternal Grandmother    Cancer Paternal Grandmother    Diabetes Paternal Grandfather    Hypertension Paternal Grandfather    Heart disease Paternal Aunt    Social History   Tobacco Use   Smoking status: Never   Smokeless tobacco: Never  Vaping Use   Vaping status: Never Used  Substance Use Topics   Alcohol use: No    Comment: occasional   Drug use: No   No Known Allergies Medications Prior to Admission  Medication Sig Dispense Refill Last Dose/Taking   valACYclovir  (VALTREX ) 1000 MG tablet Take 1 tablet (1,000 mg total) by mouth daily. (Patient not taking: Reported on 05/27/2023) 60 tablet 5 Unknown    I have reviewed patient's Past Medical Hx, Surgical Hx, Family Hx, Social Hx, medications and allergies.   ROS  Pertinent items noted in HPI and remainder of comprehensive ROS otherwise negative.   PHYSICAL EXAM  Patient Vitals for the past 24 hrs:  BP Temp Temp src Pulse Resp SpO2 Height Weight  05/07/24 1416 115/68 98.4 F (36.9 C) Oral 92 16 100 % 5' 1 (1.549 m) 72.9 kg    Constitutional: Well-developed, well-nourished female in no acute distress.  HEENT: atraumatic, normocephalic. Neck has normal ROM. EOM intact. Cardiovascular: normal rate & rhythm, warm and well-perfused Respiratory: normal effort, no problems with respiration noted GI: Abd soft, non-tender, non-distended MSK: Extremities nontender, no edema, normal ROM Skin: warm and dry. Acyanotic, no jaundice or pallor. Neurologic: Alert and oriented x 4. No abnormal coordination. Psychiatric: Normal mood. Speech not slurred, not rapid/pressured. Patient is cooperative. GU: no CVA  tenderness Pelvic exam: VULVA: normal appearing vulva with no masses, tenderness or lesions, dried blood on vulva and upper thighs, VAGINA: vaginal canal with significant bleeding and large clots, some removed successfully with faux swabs but larger clots were not able to be removed, CERVIX: normal appearing cervix without discharge or lesions, unable to visualize due to clots obstructing view, exam chaperoned by Griselda Punter RN.   Labs: Results for orders placed or performed during the hospital encounter of 05/07/24 (from the past 24 hours)  CBC     Status: Abnormal   Collection Time: 05/07/24  2:47 PM  Result Value Ref Range   WBC 7.6 4.0 - 10.5 K/uL   RBC 3.08 (L) 3.87 - 5.11 MIL/uL   Hemoglobin 8.5 (L) 12.0 - 15.0 g/dL   HCT 74.0 (L) 63.9 - 53.9 %   MCV 84.1 80.0 - 100.0 fL   MCH 27.6 26.0 - 34.0 pg   MCHC 32.8 30.0 - 36.0 g/dL   RDW 86.7 88.4 - 84.4 %   Platelets 226 150 - 400 K/uL   nRBC 0.0 0.0 - 0.2 %  hCG, quantitative, pregnancy     Status: Abnormal   Collection Time: 05/07/24  2:47 PM  Result Value Ref Range   hCG, Beta Chain, Quant, S 54,520 (H) <5 mIU/mL    Imaging:  US  OB LESS THAN 14 WEEKS WITH OB TRANSVAGINAL Result Date: 05/07/2024 CLINICAL DATA:  Vaginal bleeding EXAM: OBSTETRIC <14 WK US  AND TRANSVAGINAL OB US  TECHNIQUE: Both transabdominal and transvaginal ultrasound examinations were performed for complete evaluation of the gestation as well as the maternal uterus, adnexal regions, and pelvic cul-de-sac. Transvaginal technique was performed to assess early pregnancy. COMPARISON:  None Available. FINDINGS: Intrauterine gestational sac: Single intrauterine gestation visualized in the cervix Yolk sac:  Visualized Embryo:  Visualized in the cervix Cardiac Activity: Visualized CRL:  17.8 mm   8 w   2 d Subchorionic hemorrhage: Large of hemorrhagic material within the uterine cavity cranial to the sac. Maternal uterus/adnexae: Left ovary measures 2.8 by 3.8 x 3 cm and  contains a corpus luteum or cyst. The right ovary measures 3.5 x 2 point by 2.6 cm. IMPRESSION: 1. Single intrauterine gestation with visualized gestational sac and fetal pole in the cervix. Large volume hematoma/hemorrhagic material within the uterus, cranial/superior to the sac. Findings presumably represent miscarriage in progress though technically cervical pregnancy not excluded in the absence of prior exams for comparison. Short-term ultrasound follow-up could be helpful to further clarify. Electronically Signed   By: Luke Bun M.D.   On: 05/07/2024 16:03    MDM & MAU COURSE  MDM: High  MAU Course: -Vital signs within normal limits. Hemodynamically stable and afebrile. -CBC with significant anemia, Hgb 8.5 which is much lower than 1 year ago. Start PO iron  supplement. -Blood and clots significant on speculum exam. -Pain  improved significantly with oxycodone . -Nothing to eat or drink since 12pm other than one sip of water with Tylenol . -US  to rule out ectopic pregnancy and evaluate for retained products of conception. -US  shows products of conception in cervix. Reviewed images, Dr. Eveline reviewed images. -Discussed with Dr. Eveline. Since products of conception do appear to be in the process of being expelled, reasonable to prescribe repeat dose of Cytotec  as well as adequate pain management and nausea medication. Does not see need for surgical intervention at this time. -Discussed management with patient, answered questions and concerns about efficacy. Patient states she just wants it to be over, so will administer first dose of Cytotec  in MAU. -Follow up in office in 1 week.  Differential diagnosis considered for vaginal bleeding includes but is not limited to: ectopic pregnancy, incomplete abortion, cervical or vaginal disorder   Orders Placed This Encounter  Procedures   US  OB LESS THAN 14 WEEKS WITH OB TRANSVAGINAL   CBC   hCG, quantitative, pregnancy   Discharge patient    Meds ordered this encounter  Medications   oxyCODONE  (Oxy IR/ROXICODONE ) immediate release tablet 5 mg    Refill:  0   ondansetron  (ZOFRAN ) tablet 4 mg   misoprostol  (CYTOTEC ) tablet 800 mcg   misoprostol  (CYTOTEC ) 200 MCG tablet    Sig: Take 4 tablets (800 mcg total) by mouth once for 1 dose.    Dispense:  4 tablet    Refill:  0   ondansetron  (ZOFRAN ) 4 MG tablet    Sig: Take 1 tablet (4 mg total) by mouth every 8 (eight) hours as needed for nausea or vomiting.    Dispense:  20 tablet    Refill:  0   ibuprofen  (ADVIL ) 600 MG tablet    Sig: Take 1 tablet (600 mg total) by mouth every 6 (six) hours as needed.    Dispense:  60 tablet    Refill:  3   oxyCODONE -acetaminophen  (PERCOCET/ROXICET) 5-325 MG tablet    Sig: Take 1 tablet by mouth every 6 (six) hours as needed for severe pain (pain score 7-10).    Dispense:  20 tablet    Refill:  0    ASSESSMENT   1. Retained products of conception following abortion     PLAN  Discharge home in stable condition with return precautions.  Repeat misoprostol  in about 12 hours. Take Zofran  30 minutes prior. Use Zofran  for nausea and Percocet for severe pain. Start scheduled ibuprofen . Follow up in office in 1 week. Message sent to [facility].    Follow-up Information     [facility] Follow up in 1 week(s).   Specialty: Obstetrics and Gynecology Contact information: [address] [place]  [address] [phone]                 Allergies as of 05/07/2024   No Known Allergies      Medication List     TAKE these medications    ibuprofen  600 MG tablet Commonly known as: ADVIL  Take 1 tablet (600 mg total) by mouth every 6 (six) hours as needed.   misoprostol  200 MCG tablet Commonly known as: CYTOTEC  Take 4 tablets (800 mcg total) by mouth once for 1 dose.   ondansetron  4 MG tablet Commonly known as: Zofran  Take 1 tablet (4 mg total) by mouth  every 8 (eight) hours as needed for nausea or vomiting.   oxyCODONE -acetaminophen  5-325 MG tablet Commonly known as:  PERCOCET/ROXICET Take 1 tablet by mouth every 6 (six) hours as needed for severe pain (pain score 7-10).   valACYclovir  1000 MG tablet Commonly known as: VALTREX  Take 1 tablet (1,000 mg total) by mouth daily.         Joesph DELENA Sear, PA

## 2024-05-17 ENCOUNTER — Ambulatory Visit: Admitting: Certified Nurse Midwife

## 2024-05-23 ENCOUNTER — Encounter: Payer: Self-pay | Admitting: *Deleted

## 2024-05-23 ENCOUNTER — Ambulatory Visit: Admitting: Family Medicine

## 2024-05-23 ENCOUNTER — Encounter: Payer: Self-pay | Admitting: Family Medicine

## 2024-05-23 VITALS — BP 124/87 | HR 79 | Wt 167.0 lb

## 2024-05-23 DIAGNOSIS — Z3201 Encounter for pregnancy test, result positive: Secondary | ICD-10-CM

## 2024-05-23 DIAGNOSIS — Z30015 Encounter for initial prescription of vaginal ring hormonal contraceptive: Secondary | ICD-10-CM

## 2024-05-23 DIAGNOSIS — Z3A01 Less than 8 weeks gestation of pregnancy: Secondary | ICD-10-CM

## 2024-05-23 DIAGNOSIS — O034 Incomplete spontaneous abortion without complication: Secondary | ICD-10-CM

## 2024-05-23 LAB — POCT URINE PREGNANCY: Preg Test, Ur: POSITIVE — AB

## 2024-05-23 MED ORDER — ETONOGESTREL-ETHINYL ESTRADIOL 0.12-0.015 MG/24HR VA RING
VAGINAL_RING | VAGINAL | 4 refills | Status: AC
Start: 2024-05-23 — End: ?

## 2024-05-23 NOTE — Progress Notes (Signed)
    Subjective:    Patient ID: Deanna Perez is a 28 y.o. female presenting with Contraception  on 05/23/2024  HPI: Had retained POC after TAB. Had cytotec  x 3 and then had a D & C last week.   Review of Systems  Constitutional:  Negative for chills and fever.  Respiratory:  Negative for shortness of breath.   Cardiovascular:  Negative for chest pain.  Gastrointestinal:  Negative for abdominal pain, nausea and vomiting.  Genitourinary:  Negative for dysuria.  Skin:  Negative for rash.      Objective:    BP 124/87   Pulse 79   Wt 167 lb (75.8 kg)   LMP 02/27/2024 (Exact Date)   Breastfeeding No   BMI 31.55 kg/m  Physical Exam Exam conducted with a chaperone present.  Constitutional:      General: She is not in acute distress.    Appearance: She is well-developed.  HENT:     Head: Normocephalic and atraumatic.  Eyes:     General: No scleral icterus. Cardiovascular:     Rate and Rhythm: Normal rate.  Pulmonary:     Effort: Pulmonary effort is normal.  Abdominal:     Palpations: Abdomen is soft.  Musculoskeletal:     Cervical back: Neck supple.  Skin:    General: Skin is warm and dry.  Neurological:     Mental Status: She is alert and oriented to person, place, and time.         Assessment & Plan:  Incomplete abortion - UPT is still positive. If bleeding persists, consider BHCG to  r/o molar pregnancy. - Plan: POCT urine pregnancy  Encounter for initial prescription of vaginal ring hormonal contraceptive - rx for Nuva ring today. - Plan: etonogestrel -ethinyl estradiol  (NUVARING) 0.12-0.015 MG/24HR vaginal ring   No follow-ups on file.  Glenys GORMAN Birk, MD 05/23/2024 11:39 AM

## 2024-05-23 NOTE — Progress Notes (Signed)
 RGYN here to discuss [facility] options considering NuvaRing.  Pt denies any unprotected intercourse in last x 14 days.   Recently had abortion cytotec  on 04/29/24. Had to go to MAU on 05/07/24.   Noted 3 rounds of Cytotec .  D&C 05/17/24 at [facility].   No longer bleeding, notes back pain and soreness.   UPT Still positive today.

## 2024-07-06 DIAGNOSIS — Z6830 Body mass index (BMI) 30.0-30.9, adult: Secondary | ICD-10-CM | POA: Diagnosis not present

## 2024-07-06 DIAGNOSIS — D649 Anemia, unspecified: Secondary | ICD-10-CM | POA: Diagnosis not present

## 2024-07-06 DIAGNOSIS — E66811 Obesity, class 1: Secondary | ICD-10-CM | POA: Diagnosis not present

## 2024-07-06 DIAGNOSIS — F411 Generalized anxiety disorder: Secondary | ICD-10-CM | POA: Diagnosis not present

## 2024-07-06 DIAGNOSIS — Z114 Encounter for screening for human immunodeficiency virus [HIV]: Secondary | ICD-10-CM | POA: Diagnosis not present

## 2024-07-06 DIAGNOSIS — E6609 Other obesity due to excess calories: Secondary | ICD-10-CM | POA: Diagnosis not present

## 2024-07-12 DIAGNOSIS — Z6830 Body mass index (BMI) 30.0-30.9, adult: Secondary | ICD-10-CM | POA: Diagnosis not present

## 2024-07-12 DIAGNOSIS — D649 Anemia, unspecified: Secondary | ICD-10-CM | POA: Diagnosis not present

## 2024-07-12 DIAGNOSIS — F411 Generalized anxiety disorder: Secondary | ICD-10-CM | POA: Diagnosis not present

## 2024-07-12 DIAGNOSIS — E6609 Other obesity due to excess calories: Secondary | ICD-10-CM | POA: Diagnosis not present

## 2024-07-12 DIAGNOSIS — E66811 Obesity, class 1: Secondary | ICD-10-CM | POA: Diagnosis not present

## 2024-07-12 NOTE — Progress Notes (Signed)
 ENCOUNTER: Patient Class :No patient class for patient encounter Department: [facility] [address] [place] [address]  PATIENT: Patient Demographics      Name Patient ID SSN Gender Identity Birth Date   Deanna Perez, Camera [id number] Female Aug 10, 1996 (27 yrs)          Address Phone Email       [address] [place] [address] [phone] ([email or web address]            County Race         [place] Black or African American             Reg Status PCP Date Last Verified Next Review Date     Verified Epifanio Alm [place] [phone] 07/04/24 08/03/24           Marital Status Religion Language       Single Other English              EMERGENCY CONTACT: Name Relationship Lgl Grd Work Marine scientist Phone  1. [facility] Parent   [phone]     GUARANTOR: There is no guarantor information entered for this encounter.  COVERAGE: Primary Visit Coverage      Payer Plan Group Number Group Name Payer Phone Plan Phone   No coverage found                Secondary Visit Coverage      Payer Plan Group Number Group Name Payer Phone Plan Phone   No coverage found                Primary Coverage      Payer Plan Group Number Group Name Payer Phone Plan Phone   [facility] HEALTHY [place] [facility] HEALTHY BLUE [id number] [place]  MEDICAID  [phone]           Primary Subscriber      Subscriber ID Subscriber Name Subscriber [facility] Subscriber Address   [id number] [facility] [id number]      [place], [place] [address]           Secondary Coverage      Payer Plan Group Number Group Name Payer Phone Plan Phone   No coverage found

## 2024-07-17 DIAGNOSIS — H5213 Myopia, bilateral: Secondary | ICD-10-CM | POA: Diagnosis not present

## 2024-07-20 ENCOUNTER — Inpatient Hospital Stay

## 2024-07-20 ENCOUNTER — Inpatient Hospital Stay: Admitting: Oncology

## 2024-07-28 ENCOUNTER — Other Ambulatory Visit

## 2024-07-28 ENCOUNTER — Inpatient Hospital Stay: Attending: Oncology | Admitting: Oncology

## 2024-07-28 ENCOUNTER — Encounter: Admitting: Oncology

## 2024-07-28 ENCOUNTER — Encounter: Payer: Self-pay | Admitting: Oncology

## 2024-07-28 ENCOUNTER — Inpatient Hospital Stay

## 2024-07-28 VITALS — BP 135/97 | HR 71 | Temp 98.6°F | Resp 18 | Wt 156.9 lb

## 2024-07-28 DIAGNOSIS — Z8051 Family history of malignant neoplasm of kidney: Secondary | ICD-10-CM | POA: Insufficient documentation

## 2024-07-28 DIAGNOSIS — D509 Iron deficiency anemia, unspecified: Secondary | ICD-10-CM | POA: Diagnosis not present

## 2024-07-28 DIAGNOSIS — Z800 Family history of malignant neoplasm of digestive organs: Secondary | ICD-10-CM | POA: Diagnosis not present

## 2024-07-28 NOTE — Progress Notes (Signed)
 Hematology/Oncology Progress note [phone] [phone]           REFERRING PROVIDER: Epifanio Alm SQUIBB, MD   CHIEF COMPLAINTS/REASON FOR VISIT:  Evaluation of iron  deficiency anemia.     ASSESSMENT & PLAN:   Iron  deficiency anemia Labs reviewed and discussed with patient Consistent with severe iron  deficiency anemia.  Likely secondary to heavy menstrual period Patient does not tolerate oral iron  supplementation. I discussed about IV Venofer treatments. I discussed about the potential risks including but not limited to allergic reactions/infusion reactions including anaphylactic reactions, diarrhea, phlebitis, high blood pressure, wheezing, SOB, skin rash, weight gain,dark urine, leg swelling, back pain, headache, nausea and fatigue, etc. Patient tolerates oral iron  supplement poorly and desires to achieved higher level of iron  faster for adequate hematopoesis. Plan IV venofer weekly x 4    Orders Placed This Encounter  Procedures   CBC with Differential (Cancer Center Only)    Standing Status:   Future    Expected Date:   10/27/2024    Expiration Date:   01/25/2025   Iron  and TIBC    Standing Status:   Future    Expected Date:   10/27/2024    Expiration Date:   01/25/2025   Ferritin    Standing Status:   Future    Expected Date:   10/27/2024    Expiration Date:   01/25/2025   Follow-up in 3 months. All questions were answered. The patient knows to call the clinic with any problems, questions or concerns.  Zelphia Cap, MD, PhD [facility] Hematology Oncology 07/28/2024   HISTORY OF PRESENTING ILLNESS:   Deanna Perez is a  28 y.o.  female with PMH listed below was seen in consultation at the request of  Epifanio Alm SQUIBB, MD  for evaluation of iron  deficiency anemia  Patient had a recent abortion after which she has had heavy menstrual bleeding. By 02/20/2024, CBC showed a hemoglobin of 8.5, MCV 76.4, ferritin 1, B12 556.  Patient has tried  oral iron  supplementation and not able to tolerate due to GI side effects.  Patient reports feeling tired and fatigued.  She has NuvaRing Patient is single, she has 2 children.  MEDICAL HISTORY:  Past Medical History:  Diagnosis Date   Cholestasis during pregnancy in third trimester 09/27/2017   Gonorrhea    Herpes genitalis in women 11/03/2017    SURGICAL HISTORY: Past Surgical History:  Procedure Laterality Date   CESAREAN SECTION N/A 09/28/2017   Procedure: CESAREAN SECTION;  Surgeon: Barbra Lang PARAS, DO;  Location: [facility];  Service: Obstetrics;  Laterality: N/A;    SOCIAL HISTORY: Social History   Socioeconomic History   Marital status: Single    Spouse name: Not on file   Number of children: Not on file   Years of education: Not on file   Highest education level: Not on file  Occupational History    Employer: [employer]    Comment: cutsomer service  Tobacco Use   Smoking status: Never   Smokeless tobacco: Never  Vaping Use   Vaping status: Never Used  Substance and Sexual Activity   Alcohol use: No    Comment: occasional   Drug use: No   Sexual activity: Yes    Birth control/protection: Implant  Other Topics Concern   Not on file  Social History Narrative   Not on file   Social Drivers of Health   Financial Resource Strain: Low Risk  (11/30/2023)   Received from [facility]   Overall Financial Resource Strain (CARDIA)    Difficulty of Paying Living Expenses: Not hard at all  Food Insecurity: No Food Insecurity (07/28/2024)   Hunger Vital Sign    Worried About Running Out of Food in the Last Year: Never true    Ran Out of Food in the Last Year: Never true  Transportation Needs: No Transportation Needs (07/28/2024)   PRAPARE - Administrator, Civil Service (Medical): No    Lack of Transportation (Non-Medical): No  Physical Activity: Not on file  Stress: No Stress Concern Present (11/01/2018)   [employer] of  Occupational Health - Occupational Stress Questionnaire    Feeling of Stress : Only a little  Social Connections: Not on file  Intimate Partner Violence: Not At Risk (07/28/2024)   Humiliation, Afraid, Rape, and Kick questionnaire    Fear of Current or Ex-Partner: No    Emotionally Abused: No    Physically Abused: No    Sexually Abused: No    FAMILY HISTORY: Family History  Problem Relation Age of Onset   Diabetes Mother    Hypertension Mother    Diabetes Father    Hypertension Father    Heart disease Paternal Aunt    Kidney disease Maternal Grandmother    Kidney cancer Maternal Grandmother    Pancreatic cancer Maternal Grandfather    Hypertension Paternal Grandmother    Diabetes Paternal Grandmother    Liver cancer Paternal Grandmother    Diabetes Paternal Grandfather    Hypertension Paternal Grandfather     ALLERGIES:  has no known allergies.  MEDICATIONS:  Current Outpatient Medications  Medication Sig Dispense Refill   etonogestrel -ethinyl estradiol  (NUVARING) 0.12-0.015 MG/24HR vaginal ring Insert vaginally and leave in place for 3 consecutive weeks, then remove for 1 week. 3 each 4   ondansetron  (ZOFRAN ) 4 MG tablet Take 1 tablet (4 mg total) by mouth every 8 (eight) hours as needed for nausea or vomiting. 20 tablet 0   buPROPion (WELLBUTRIN XL) 150 MG 24 hr tablet Take 150 mg by mouth daily. (Patient not taking: Reported on 07/28/2024)     valACYclovir  (VALTREX ) 1000 MG tablet Take 1 tablet (1,000 mg total) by mouth daily. (Patient not taking: Reported on 07/28/2024) 60 tablet 5   No current facility-administered medications for this visit.    Review of Systems  Constitutional:  Positive for fatigue. Negative for appetite change, chills and fever.  HENT:   Negative for hearing loss and voice change.   Eyes:  Negative for eye problems.  Respiratory:  Negative for chest tightness and cough.   Cardiovascular:  Negative for chest pain.  Gastrointestinal:  Negative for  abdominal distention, abdominal pain and blood in stool.  Endocrine: Negative for hot flashes.  Genitourinary:  Positive for menstrual problem. Negative for difficulty urinating and frequency.   Musculoskeletal:  Negative for arthralgias.  Skin:  Negative for itching and rash.  Neurological:  Negative for extremity weakness.  Hematological:  Negative for adenopathy.  Psychiatric/Behavioral:  Negative for confusion.    PHYSICAL EXAMINATION:  Vitals:   07/28/24 1245  BP: (!) 135/97  Pulse: 71  Resp: 18  Temp: 98.6 F (37 C)   Filed Weights   07/28/24 1245  Weight: 156 lb 14.4 oz (71.2 kg)    Physical Exam Constitutional:      General: She is not in acute distress. HENT:     Head: Normocephalic and atraumatic.  Eyes:     General: No scleral  icterus. Cardiovascular:     Rate and Rhythm: Normal rate and regular rhythm.     Heart sounds: Normal heart sounds.  Pulmonary:     Effort: Pulmonary effort is normal. No respiratory distress.     Breath sounds: No wheezing.  Abdominal:     General: Bowel sounds are normal. There is no distension.     Palpations: Abdomen is soft.  Musculoskeletal:        General: No deformity. Normal range of motion.     Cervical back: Normal range of motion and neck supple.  Skin:    General: Skin is warm and dry.     Findings: No erythema or rash.  Neurological:     Mental Status: She is alert and oriented to person, place, and time. Mental status is at baseline.  Psychiatric:        Mood and Affect: Mood normal.     LABORATORY DATA:  I have reviewed the data as listed    Latest Ref Rng & Units 05/07/2024    2:47 PM 05/27/2023    8:34 PM 09/05/2020    3:44 PM  CBC  WBC 4.0 - 10.5 K/uL 7.6  6.6  6.1   Hemoglobin 12.0 - 15.0 g/dL 8.5  88.5  87.4   Hematocrit 36.0 - 46.0 % 25.9  35.8  38.4   Platelets 150 - 400 K/uL 226  273  280       Latest Ref Rng & Units 05/27/2023    8:34 PM 11/07/2018    7:08 AM 09/27/2018   12:20 PM  CMP   Glucose 70 - 99 mg/dL 892  88  67   BUN 6 - 20 mg/dL 13  12  8    Creatinine 0.44 - 1.00 mg/dL 9.03  9.13  9.18   Sodium 135 - 145 mmol/L 134  132  133   Potassium 3.5 - 5.1 mmol/L 3.9  4.3  4.2   Chloride 98 - 111 mmol/L 102  104  99   CO2 22 - 32 mmol/L 23  18  19    Calcium  8.9 - 10.3 mg/dL 9.2  8.7  8.8   Total Protein 6.5 - 8.1 g/dL 7.7  7.4  6.7   Total Bilirubin 0.3 - 1.2 mg/dL 0.3  0.5  <9.7   Alkaline Phos 38 - 126 U/L 78  262  152   AST 15 - 41 U/L 19  22  16    ALT 0 - 44 U/L 30  13  13        RADIOGRAPHIC STUDIES: I have personally reviewed the radiological images as listed and agreed with the findings in the report. US  OB LESS THAN 14 WEEKS WITH OB TRANSVAGINAL Result Date: 05/07/2024 CLINICAL DATA:  Vaginal bleeding EXAM: OBSTETRIC <14 WK US  AND TRANSVAGINAL OB US  TECHNIQUE: Both transabdominal and transvaginal ultrasound examinations were performed for complete evaluation of the gestation as well as the maternal uterus, adnexal regions, and pelvic cul-de-sac. Transvaginal technique was performed to assess early pregnancy. COMPARISON:  None Available. FINDINGS: Intrauterine gestational sac: Single intrauterine gestation visualized in the cervix Yolk sac:  Visualized Embryo:  Visualized in the cervix Cardiac Activity: Visualized CRL:  17.8 mm   8 w   2 d Subchorionic hemorrhage: Large of hemorrhagic material within the uterine cavity cranial to the sac. Maternal uterus/adnexae: Left ovary measures 2.8 by 3.8 x 3 cm and contains a corpus luteum or cyst. The right ovary measures 3.5 x 2 point by 2.6 cm.  IMPRESSION: 1. Single intrauterine gestation with visualized gestational sac and fetal pole in the cervix. Large volume hematoma/hemorrhagic material within the uterus, cranial/superior to the sac. Findings presumably represent miscarriage in progress though technically cervical pregnancy not excluded in the absence of prior exams for comparison. Short-term ultrasound follow-up could be  helpful to further clarify. Electronically Signed   By: Luke Bun M.D.   On: 05/07/2024 16:03

## 2024-07-28 NOTE — Assessment & Plan Note (Signed)
 Labs reviewed and discussed with patient Consistent with severe iron  deficiency anemia.  Likely secondary to heavy menstrual period Patient does not tolerate oral iron  supplementation. I discussed about IV Venofer treatments. I discussed about the potential risks including but not limited to allergic reactions/infusion reactions including anaphylactic reactions, diarrhea, phlebitis, high blood pressure, wheezing, SOB, skin rash, weight gain,dark urine, leg swelling, back pain, headache, nausea and fatigue, etc. Patient tolerates oral iron  supplement poorly and desires to achieved higher level of iron  faster for adequate hematopoesis. Plan IV venofer weekly x 4

## 2024-08-03 ENCOUNTER — Ambulatory Visit: Admitting: Family Medicine

## 2024-08-04 ENCOUNTER — Inpatient Hospital Stay: Attending: Oncology

## 2024-08-04 VITALS — BP 124/91 | HR 74 | Temp 99.0°F | Resp 16

## 2024-08-04 DIAGNOSIS — D509 Iron deficiency anemia, unspecified: Secondary | ICD-10-CM | POA: Insufficient documentation

## 2024-08-04 MED ORDER — IRON SUCROSE 20 MG/ML IV SOLN
200.0000 mg | Freq: Once | INTRAVENOUS | Status: AC
  Administered 2024-08-04: 200 mg via INTRAVENOUS
  Filled 2024-08-04: qty 10

## 2024-08-04 NOTE — Patient Instructions (Signed)

## 2024-08-07 ENCOUNTER — Other Ambulatory Visit: Payer: Self-pay | Admitting: Family Medicine

## 2024-08-07 DIAGNOSIS — B009 Herpesviral infection, unspecified: Secondary | ICD-10-CM

## 2024-08-08 ENCOUNTER — Telehealth: Payer: Self-pay

## 2024-08-08 ENCOUNTER — Other Ambulatory Visit: Payer: Self-pay

## 2024-08-08 DIAGNOSIS — B009 Herpesviral infection, unspecified: Secondary | ICD-10-CM

## 2024-08-08 MED ORDER — VALACYCLOVIR HCL 1 G PO TABS: 1000.0000 mg | ORAL_TABLET | Freq: Every day | ORAL | 5 refills | Status: AC

## 2024-08-08 NOTE — Telephone Encounter (Signed)
 Return call to pt regarding refill request for Valtrex  Rx refilled per protocol .

## 2024-08-11 ENCOUNTER — Inpatient Hospital Stay

## 2024-08-11 VITALS — BP 122/81 | HR 80 | Temp 97.2°F | Resp 18

## 2024-08-11 DIAGNOSIS — D509 Iron deficiency anemia, unspecified: Secondary | ICD-10-CM

## 2024-08-11 MED ORDER — IRON SUCROSE 20 MG/ML IV SOLN
200.0000 mg | Freq: Once | INTRAVENOUS | Status: AC
  Administered 2024-08-11: 200 mg via INTRAVENOUS
  Filled 2024-08-11: qty 10

## 2024-08-11 NOTE — Patient Instructions (Signed)

## 2024-08-18 ENCOUNTER — Inpatient Hospital Stay

## 2024-08-18 VITALS — BP 122/78 | HR 88 | Temp 97.1°F | Resp 16

## 2024-08-18 DIAGNOSIS — D509 Iron deficiency anemia, unspecified: Secondary | ICD-10-CM | POA: Diagnosis not present

## 2024-08-18 MED ORDER — IRON SUCROSE 20 MG/ML IV SOLN
200.0000 mg | Freq: Once | INTRAVENOUS | Status: AC
  Administered 2024-08-18: 200 mg via INTRAVENOUS
  Filled 2024-08-18: qty 10

## 2024-08-18 MED ORDER — SODIUM CHLORIDE 0.9% FLUSH
10.0000 mL | Freq: Once | INTRAVENOUS | Status: AC | PRN
  Administered 2024-08-18: 10 mL
  Filled 2024-08-18: qty 10

## 2024-08-25 ENCOUNTER — Inpatient Hospital Stay

## 2024-08-25 VITALS — BP 132/80 | HR 68 | Temp 97.5°F | Resp 18

## 2024-08-25 DIAGNOSIS — D509 Iron deficiency anemia, unspecified: Secondary | ICD-10-CM | POA: Diagnosis not present

## 2024-08-25 MED ORDER — IRON SUCROSE 20 MG/ML IV SOLN
200.0000 mg | Freq: Once | INTRAVENOUS | Status: AC
  Administered 2024-08-25: 200 mg via INTRAVENOUS
  Filled 2024-08-25: qty 10

## 2024-08-25 NOTE — Patient Instructions (Signed)

## 2024-11-03 ENCOUNTER — Inpatient Hospital Stay: Attending: Oncology

## 2024-11-03 DIAGNOSIS — D509 Iron deficiency anemia, unspecified: Secondary | ICD-10-CM | POA: Insufficient documentation

## 2024-11-03 LAB — CBC WITH DIFFERENTIAL (CANCER CENTER ONLY)
Abs Immature Granulocytes: 0.01 K/uL (ref 0.00–0.07)
Basophils Absolute: 0 K/uL (ref 0.0–0.1)
Basophils Relative: 0 %
Eosinophils Absolute: 0.1 K/uL (ref 0.0–0.5)
Eosinophils Relative: 2 %
HCT: 37.5 % (ref 36.0–46.0)
Hemoglobin: 11.8 g/dL — ABNORMAL LOW (ref 12.0–15.0)
Immature Granulocytes: 0 %
Lymphocytes Relative: 36 %
Lymphs Abs: 1.8 K/uL (ref 0.7–4.0)
MCH: 26.3 pg (ref 26.0–34.0)
MCHC: 31.5 g/dL (ref 30.0–36.0)
MCV: 83.5 fL (ref 80.0–100.0)
Monocytes Absolute: 0.4 K/uL (ref 0.1–1.0)
Monocytes Relative: 8 %
Neutro Abs: 2.7 K/uL (ref 1.7–7.7)
Neutrophils Relative %: 54 %
Platelet Count: 265 K/uL (ref 150–400)
RBC: 4.49 MIL/uL (ref 3.87–5.11)
RDW: 15.9 % — ABNORMAL HIGH (ref 11.5–15.5)
WBC Count: 5 K/uL (ref 4.0–10.5)
nRBC: 0 % (ref 0.0–0.2)

## 2024-11-03 LAB — IRON AND TIBC
Iron: 62 ug/dL (ref 28–170)
Saturation Ratios: 15 % (ref 10.4–31.8)
TIBC: 417 ug/dL (ref 250–450)
UIBC: 355 ug/dL

## 2024-11-03 LAB — FERRITIN: Ferritin: 84 ng/mL (ref 11–307)

## 2024-11-09 ENCOUNTER — Telehealth: Payer: Self-pay | Admitting: Oncology

## 2024-11-09 NOTE — Telephone Encounter (Signed)
 Spoke with pt and confirmed the virtual visit. Infusion appt canceled for now and will schedule per tomorrows new wrap up. Pt aware

## 2024-11-09 NOTE — Telephone Encounter (Signed)
 Pt calling and states that her son has the flu and she wants to know if she can have a virtual visit with Dr. Babara tomorrow and schedule her iron  inf til next week? Please advise? [phone]

## 2024-11-10 ENCOUNTER — Ambulatory Visit: Admitting: Oncology

## 2024-11-10 ENCOUNTER — Inpatient Hospital Stay: Admitting: Oncology

## 2024-11-10 ENCOUNTER — Ambulatory Visit

## 2024-11-10 ENCOUNTER — Encounter: Payer: Self-pay | Admitting: Oncology

## 2024-11-10 DIAGNOSIS — D509 Iron deficiency anemia, unspecified: Secondary | ICD-10-CM

## 2024-11-10 NOTE — Assessment & Plan Note (Addendum)
 Labs reviewed and discussed with patient Lab Results  Component Value Date   HGB 11.8 (L) 11/03/2024   TIBC 417 11/03/2024   IRONPCTSAT 15 11/03/2024   FERRITIN 84 11/03/2024   Iron  panel has normalized.  Hold off additional IV Venofer .  Monitor hemoglobin in 6 months.

## 2024-11-11 ENCOUNTER — Encounter: Payer: Self-pay | Admitting: Oncology

## 2024-11-11 NOTE — Progress Notes (Signed)
 HEMATOLOGY-ONCOLOGY TeleHEALTH VISIT PROGRESS NOTE  I connected with Deanna Perez on 11/11/2024  at 12:45 PM EST by video enabled telemedicine visit and verified that I am speaking with the correct person using two identifiers. I discussed the limitations, risks, security and privacy concerns of performing an evaluation and management service by telemedicine and the availability of in-person appointments. The patient expressed understanding and agreed to proceed.   Other persons participating in the visit and their role in the encounter:  None  Patient's location: Home  Provider's location: office Chief Complaint:    INTERVAL HISTORY Deanna Perez is a 29 y.o. female who has above history reviewed by me today presents for follow up visit for management of iron  deficiency anemia She tolerated IV Venofer  treatments. Overall she feels well today.  Review of Systems  Constitutional:  Negative for appetite change, chills, fatigue and fever.  HENT:   Negative for hearing loss and voice change.   Eyes:  Negative for eye problems.  Respiratory:  Negative for chest tightness and cough.   Cardiovascular:  Negative for chest pain.  Gastrointestinal:  Negative for abdominal distention, abdominal pain and blood in stool.  Endocrine: Negative for hot flashes.  Genitourinary:  Negative for difficulty urinating and frequency.   Musculoskeletal:  Negative for arthralgias.  Skin:  Negative for itching and rash.  Neurological:  Negative for extremity weakness.  Hematological:  Negative for adenopathy.  Psychiatric/Behavioral:  Negative for confusion.     Past Medical History:  Diagnosis Date   Cholestasis during pregnancy in third trimester 09/27/2017   Gonorrhea    Herpes genitalis in women 11/03/2017   Past Surgical History:  Procedure Laterality Date   CESAREAN SECTION N/A 09/28/2017   Procedure: CESAREAN SECTION;  Surgeon: Barbra Lang PARAS, DO;  Location: [facility];  Service:  Obstetrics;  Laterality: N/A;    Family History  Problem Relation Age of Onset   Diabetes Mother    Hypertension Mother    Diabetes Father    Hypertension Father    Heart disease Paternal Aunt    Kidney disease Maternal Grandmother    Kidney cancer Maternal Grandmother    Pancreatic cancer Maternal Grandfather    Hypertension Paternal Grandmother    Diabetes Paternal Grandmother    Liver cancer Paternal Grandmother    Diabetes Paternal Grandfather    Hypertension Paternal Grandfather     Social History   Socioeconomic History   Marital status: Single    Spouse name: Not on file   Number of children: Not on file   Years of education: Not on file   Highest education level: Not on file  Occupational History    Employer: [employer]    Comment: cutsomer service  Tobacco Use   Smoking status: Never   Smokeless tobacco: Never  Vaping Use   Vaping status: Never Used  Substance and Sexual Activity   Alcohol use: No    Comment: occasional   Drug use: No   Sexual activity: Yes    Birth control/protection: Implant  Other Topics Concern   Not on file  Social History Narrative   Not on file   Social Drivers of Health   Tobacco Use: Low Risk (11/10/2024)   Patient History    Smoking Tobacco Use: Never    Smokeless Tobacco Use: Never    Passive Exposure: Not on file  Financial Resource Strain: Low Risk  (11/30/2023)   Received from [facility]   Overall Financial Resource Strain (  CARDIA)    Difficulty of Paying Living Expenses: Not hard at all  Food Insecurity: No Food Insecurity (07/28/2024)   Epic    Worried About Programme Researcher, Broadcasting/film/video in the Last Year: Never true    Ran Out of Food in the Last Year: Never true  Transportation Needs: No Transportation Needs (07/28/2024)   Epic    Lack of Transportation (Medical): No    Lack of Transportation (Non-Medical): No  Physical Activity: Not on file  Stress: Not on file  Social Connections: Not on file  Intimate  Partner Violence: Not At Risk (07/28/2024)   Epic    Fear of Current or Ex-Partner: No    Emotionally Abused: No    Physically Abused: No    Sexually Abused: No  Depression (PHQ2-9): Low Risk (08/11/2024)   Depression (PHQ2-9)    PHQ-2 Score: 0  Alcohol Screen: Not on file  Housing: Low Risk (07/28/2024)   Epic    Unable to Pay for Housing in the Last Year: No    Number of Times Moved in the Last Year: 0    Homeless in the Last Year: No  Utilities: Not At Risk (07/28/2024)   Epic    Threatened with loss of utilities: No  Health Literacy: Not on file    Medications Ordered Prior to Encounter[1]  Allergies[2]     Observations/Objective: There were no vitals filed for this visit. There is no height or weight on file to calculate BMI.  Physical Exam  CBC    Component Value Date/Time   WBC 5.0 11/03/2024 1458   WBC 7.6 05/07/2024 1447   RBC 4.49 11/03/2024 1458   HGB 11.8 (L) 11/03/2024 1458   HGB 12.5 09/05/2020 1544   HCT 37.5 11/03/2024 1458   HCT 38.4 09/05/2020 1544   PLT 265 11/03/2024 1458   PLT 280 09/05/2020 1544   MCV 83.5 11/03/2024 1458   MCV 84 09/05/2020 1544   MCH 26.3 11/03/2024 1458   MCHC 31.5 11/03/2024 1458   RDW 15.9 (H) 11/03/2024 1458   RDW 12.7 09/05/2020 1544   LYMPHSABS 1.8 11/03/2024 1458   LYMPHSABS 1.7 04/28/2018 1320   MONOABS 0.4 11/03/2024 1458   EOSABS 0.1 11/03/2024 1458   EOSABS 0.1 04/28/2018 1320   BASOSABS 0.0 11/03/2024 1458   BASOSABS 0.0 04/28/2018 1320    CMP     Component Value Date/Time   NA 134 (L) 05/27/2023 2034   NA 133 (L) 09/27/2018 1220   K 3.9 05/27/2023 2034   CL 102 05/27/2023 2034   CO2 23 05/27/2023 2034   GLUCOSE 107 (H) 05/27/2023 2034   BUN 13 05/27/2023 2034   BUN 8 09/27/2018 1220   CREATININE 0.96 05/27/2023 2034   CALCIUM  9.2 05/27/2023 2034   PROT 7.7 05/27/2023 2034   PROT 6.7 09/27/2018 1220   ALBUMIN 3.8 05/27/2023 2034   ALBUMIN 3.6 09/27/2018 1220   AST 19 05/27/2023 2034   ALT 30  05/27/2023 2034   ALKPHOS 78 05/27/2023 2034   BILITOT 0.3 05/27/2023 2034   BILITOT <0.2 09/27/2018 1220   GFRNONAA >60 05/27/2023 2034   GFRAA >60 11/07/2018 0708    ASSESSMENT & PLAN:   Iron  deficiency anemia Labs reviewed and discussed with patient Lab Results  Component Value Date   HGB 11.8 (L) 11/03/2024   TIBC 417 11/03/2024   IRONPCTSAT 15 11/03/2024   FERRITIN 84 11/03/2024   Iron  panel has normalized.  Hold off additional IV Venofer .  Monitor hemoglobin  in 6 months.  Orders Placed This Encounter  Procedures   CBC with Differential (Cancer Center Only)    Standing Status:   Future    Expected Date:   05/10/2025    Expiration Date:   08/08/2025   Iron  and TIBC    Standing Status:   Future    Expected Date:   05/10/2025    Expiration Date:   08/08/2025   Ferritin    Standing Status:   Future    Expected Date:   05/10/2025    Expiration Date:   08/08/2025   Retic Panel    Standing Status:   Future    Expected Date:   05/10/2025    Expiration Date:   08/08/2025  I discussed the assessment and treatment plan with the patient. The patient was provided an opportunity to ask questions and all were answered. The patient agreed with the plan and demonstrated an understanding of the instructions.  The patient was advised to call back or seek an in-person evaluation if the symptoms worsen or if the condition fails to improve as anticipated.  Follow-up in 6 months  Zelphia Cap, MD 11/11/2024 2:50 PM      [1]  Current Outpatient Medications on File Prior to Visit  Medication Sig Dispense Refill   buPROPion (WELLBUTRIN XL) 150 MG 24 hr tablet Take 150 mg by mouth daily.     etonogestrel -ethinyl estradiol  (NUVARING) 0.12-0.015 MG/24HR vaginal ring Insert vaginally and leave in place for 3 consecutive weeks, then remove for 1 week. 3 each 4   ondansetron  (ZOFRAN ) 4 MG tablet Take 1 tablet (4 mg total) by mouth every 8 (eight) hours as needed for nausea or vomiting. 20 tablet 0    valACYclovir  (VALTREX ) 1000 MG tablet Take 1 tablet (1,000 mg total) by mouth daily. 60 tablet 5   No current facility-administered medications on file prior to visit.  [2] No Known Allergies

## 2025-05-11 ENCOUNTER — Inpatient Hospital Stay

## 2025-05-16 ENCOUNTER — Inpatient Hospital Stay: Admitting: Oncology
